# Patient Record
Sex: Female | Born: 1945 | Race: White | Hispanic: No | Smoking: Never smoker
Health system: Southern US, Community
[De-identification: ages and names within clinical notes are randomized; demographics above are authoritative.]

## PROBLEM LIST (undated history)

## (undated) DIAGNOSIS — H3320 Serous retinal detachment, unspecified eye: Secondary | ICD-10-CM

## (undated) DIAGNOSIS — I10 Essential (primary) hypertension: Secondary | ICD-10-CM

## (undated) DIAGNOSIS — E785 Hyperlipidemia, unspecified: Secondary | ICD-10-CM

## (undated) HISTORY — DX: Essential (primary) hypertension: I10

## (undated) HISTORY — DX: Serous retinal detachment, unspecified eye: H33.20

## (undated) HISTORY — DX: Hyperlipidemia, unspecified: E78.5

---

## 1998-01-08 ENCOUNTER — Other Ambulatory Visit: Admission: RE | Admit: 1998-01-08 | Discharge: 1998-01-08 | Payer: Self-pay | Admitting: *Deleted

## 1999-01-14 ENCOUNTER — Other Ambulatory Visit: Admission: RE | Admit: 1999-01-14 | Discharge: 1999-01-14 | Payer: Self-pay | Admitting: *Deleted

## 1999-06-24 ENCOUNTER — Ambulatory Visit (HOSPITAL_COMMUNITY): Admission: RE | Admit: 1999-06-24 | Discharge: 1999-06-24 | Payer: Self-pay | Admitting: *Deleted

## 2000-01-20 ENCOUNTER — Other Ambulatory Visit: Admission: RE | Admit: 2000-01-20 | Discharge: 2000-01-20 | Payer: Self-pay | Admitting: *Deleted

## 2000-01-25 ENCOUNTER — Ambulatory Visit (HOSPITAL_COMMUNITY): Admission: RE | Admit: 2000-01-25 | Discharge: 2000-01-25 | Payer: Self-pay | Admitting: *Deleted

## 2001-01-23 ENCOUNTER — Other Ambulatory Visit: Admission: RE | Admit: 2001-01-23 | Discharge: 2001-01-23 | Payer: Self-pay | Admitting: *Deleted

## 2001-02-16 ENCOUNTER — Encounter: Payer: Self-pay | Admitting: Gastroenterology

## 2001-02-16 ENCOUNTER — Ambulatory Visit (HOSPITAL_COMMUNITY): Admission: RE | Admit: 2001-02-16 | Discharge: 2001-02-16 | Payer: Self-pay | Admitting: Gastroenterology

## 2001-03-16 ENCOUNTER — Ambulatory Visit (HOSPITAL_COMMUNITY): Admission: RE | Admit: 2001-03-16 | Discharge: 2001-03-16 | Payer: Self-pay | Admitting: *Deleted

## 2004-02-24 ENCOUNTER — Other Ambulatory Visit: Admission: RE | Admit: 2004-02-24 | Discharge: 2004-02-24 | Payer: Self-pay | Admitting: Family Medicine

## 2005-02-23 ENCOUNTER — Other Ambulatory Visit: Admission: RE | Admit: 2005-02-23 | Discharge: 2005-02-23 | Payer: Self-pay | Admitting: Family Medicine

## 2006-02-28 ENCOUNTER — Other Ambulatory Visit: Admission: RE | Admit: 2006-02-28 | Discharge: 2006-02-28 | Payer: Self-pay | Admitting: Family Medicine

## 2006-04-11 ENCOUNTER — Emergency Department (HOSPITAL_COMMUNITY): Admission: EM | Admit: 2006-04-11 | Discharge: 2006-04-11 | Payer: Self-pay | Admitting: Emergency Medicine

## 2008-11-24 ENCOUNTER — Other Ambulatory Visit: Admission: RE | Admit: 2008-11-24 | Discharge: 2008-11-24 | Payer: Self-pay | Admitting: Family Medicine

## 2010-12-24 NOTE — Procedures (Signed)
[facility]  Patient:    Jessica Anthony, Jessica Anthony                 MRN: [id number] Proc. Date: 03/16/01 Adm. Date:  [id number] Attending:  Sabino Gasser                           Procedure Report  PROCEDURE:  Colonoscopy.  INDICATIONS:  Rectal bleeding.  ANESTHESIA:  Demerol 70, Versed 7 mg.  DESCRIPTION OF PROCEDURE:  With the patient mildly sedated in the left lateral decubitus position, the Olympus videoscopic colonoscope was inserted in the rectum and passed under direct vision to the cecum, identified by the ileocecal valve and appendiceal orifice.  Once identified, the colonoscopy was slowly withdrawn, taking circumferential views of the entire colonic mucosa, stopping only in the rectum, which appeared normal on direct view and showed internal hemorrhoids, which may have been the cause of the patients rectal bleeding on retroflex view.  The endoscope was straightened and withdrawn. The patients vital signs and pulse oximeter remained stable.  The patient tolerated the procedure well without apparent complications.  FINDINGS:  Internal hemorrhoids.  Otherwise unremarkable colonoscopic examination to the cecum.  PLAN:  Follow up with me as needed. DD:  03/16/01 TD:  03/17/01 Job: [id number]

## 2012-07-02 ENCOUNTER — Other Ambulatory Visit (HOSPITAL_COMMUNITY)
Admission: RE | Admit: 2012-07-02 | Discharge: 2012-07-02 | Disposition: A | Discharge status: Home / Self Care | Payer: PRIVATE HEALTH INSURANCE | Source: Ambulatory Visit | Attending: Family Medicine | Admitting: Family Medicine

## 2012-07-02 ENCOUNTER — Other Ambulatory Visit: Payer: Self-pay | Admitting: Family Medicine

## 2012-07-02 DIAGNOSIS — Z1151 Encounter for screening for human papillomavirus (HPV): Secondary | ICD-10-CM | POA: Insufficient documentation

## 2012-07-02 DIAGNOSIS — Z0000 Encounter for general adult medical examination without abnormal findings: Secondary | ICD-10-CM | POA: Insufficient documentation

## 2014-12-12 ENCOUNTER — Ambulatory Visit: Payer: Self-pay | Admitting: Cardiology

## 2014-12-15 ENCOUNTER — Encounter: Payer: Self-pay | Admitting: *Deleted

## 2014-12-19 ENCOUNTER — Encounter: Payer: Self-pay | Admitting: *Deleted

## 2014-12-19 ENCOUNTER — Ambulatory Visit (INDEPENDENT_AMBULATORY_CARE_PROVIDER_SITE_OTHER): Payer: Medicare Other | Admitting: Cardiology

## 2014-12-19 VITALS — BP 132/86 | HR 81 | Ht 64.0 in | Wt 118.0 lb

## 2014-12-19 DIAGNOSIS — E785 Hyperlipidemia, unspecified: Secondary | ICD-10-CM

## 2014-12-19 LAB — COMPREHENSIVE METABOLIC PANEL
ALT: 30 U/L (ref 0–35)
AST: 32 U/L (ref 0–37)
Albumin: 4.3 g/dL (ref 3.5–5.2)
Alkaline Phosphatase: 75 U/L (ref 39–117)
BUN: 11 mg/dL (ref 6–23)
CO2: 34 mEq/L — ABNORMAL HIGH (ref 19–32)
Calcium: 10.8 mg/dL — ABNORMAL HIGH (ref 8.4–10.5)
Chloride: 96 mEq/L (ref 96–112)
Creatinine, Ser: 0.73 mg/dL (ref 0.40–1.20)
GFR: 83.85 mL/min (ref >60.00)
Glucose, Bld: 99 mg/dL (ref 70–99)
Potassium: 4 mEq/L (ref 3.5–5.1)
Sodium: 135 mEq/L (ref 135–145)
Total Bilirubin: 0.4 mg/dL (ref 0.2–1.2)
Total Protein: 7.8 g/dL (ref 6.0–8.3)

## 2014-12-19 NOTE — Progress Notes (Signed)
Patient ID: Jessica Anthony, female   DOB: [birth year], 69 y.o.   MRN: [id number]      Cardiology Office Note   Date:  12/19/2014   ID:  Jessica Anthony, [place] [birth year], [name]  PCP:  Noland FordyceBHUSHAN,SUSAN D  Cardiologist:   Lars MassonNELSON, Adelaide Pfefferkorn H, MD   Chief complain: Hyperlipidemia   History of Present Illness: Jessica Anthony is a 69 y.o. female who presents for evaluation of hyperlipidemia. The patient is very active, younger appearing with no past medical history other than severe hyperlipidemia. We don't have hear actual numbers but she states that her total cholesterol gets as high and 300. She was started on the first cholesterol medicine at age 89, she has tried atorvastatin, simvastatin, rosuvastatin wit significant muscle pain. She has been taking simvastatin 80 mg po daily for few years, but then couldn't tolerate it.  She really wants to stay healthy and wants to get her cholesterol under control. She can walk 6 miles without any chest pain, or DOR. Also denies palpitations, syncope, claudications or orhopnea.  She doesn't have FH of premature CAD, SCD or CHF.     Past Medical History  Diagnosis Date  . Hyperlipemia   . Hypertension   . Detached retina     No past surgical history on file.   Current Outpatient Prescriptions  Medication Sig Dispense Refill  . LORazepam (ATIVAN) 1 MG tablet Take 1 tablet by mouth before bedtime as needed for insomnia  2  . triamterene-hydrochlorothiazide (MAXZIDE-25) 37.5-25 MG per tablet Take 1 tablet by mouth daily.  0   No current facility-administered medications for this visit.    Allergies:   Statins    Social History:  The patient  reports that she has never smoked. She does not have any smokeless tobacco history on file. She reports that she drinks alcohol. She reports that she does not use illicit drugs.   Family History:  The patient's family history includes CVA in her father; Heart attack (age of onset: 6482)  in her brother; Hyperlipidemia in an other family member; Hypertension in an other family member; Kidney cancer in her brother; Pancreatic cancer in her mother.    ROS:  Please see the history of present illness.   Otherwise, review of systems are positive for none.   All other systems are reviewed and negative.    PHYSICAL EXAM: VS:  There were no vitals taken for this visit. , BMI There is no height or weight on file to calculate BMI. GEN: Well nourished, well developed, in no acute distress HEENT: normal Neck: no JVD, carotid bruits, or masses Cardiac: RRR; no murmurs, rubs, or gallops,no edema  Respiratory:  clear to auscultation bilaterally, normal work of breathing GI: soft, nontender, nondistended, + BS MS: no deformity or atrophy Skin: warm and dry, no rash Neuro:  Strength and sensation are intact Psych: euthymic mood, full affect   EKG:  EKG is ordered today. The ekg ordered today demonstrates SR, normal ECG.   Recent Labs: No results found for requested labs within last 365 days.    Lipid Panel No results found for: CHOL, TRIG, HDL, CHOLHDL, VLDL, LDLCALC, LDLDIRECT    Wt Readings from Last 3 Encounters:  No data found for Wt      Other studies Reviewed: Additional studies/ records that were reviewed today include: records from PCP.    ASSESSMENT AND PLAN:  1.  Hyperlipidemia - we will order NMR lipids panel and lipoprotein a to  evaluate severity of her hyperlipidemia and to see if she potentially has homozygous/hetrozygous familial HLP. She has a good diet and high exercise level, so there is no room for improvement there. We will refer her to the lipid clinic for further management.   Signed, Lars MassonNELSON, Gwynn Crossley H, MD  12/19/2014 10:23 AM    [facility] [address], [place], [place]  [address] Phone: [phone]; Fax: [phone]

## 2014-12-19 NOTE — Patient Instructions (Signed)
Medication Instructions:   Your physician recommends that you continue on your current medications as directed. Please refer to the Current Medication list given to you today.   Labwork:  TODAY---CMET, NMR WITH LIPIDS, AND LIPO A    [facility]PERLIPIDEMIA     Follow-Up:  AS NEEDED WITH DR Delton SeeNELSON

## 2014-12-22 LAB — NMR LIPOPROFILE WITH LIPIDS
Cholesterol, Total: 319 mg/dL — ABNORMAL HIGH (ref 100–199)
HDL Particle Number: 46 umol/L (ref >=30.5)
HDL Size: 9.1 nm — ABNORMAL LOW (ref >=9.2)
HDL-C: 71 mg/dL (ref >39)
LDL (calc): 197 mg/dL — ABNORMAL HIGH (ref 0–99)
LDL Particle Number: 2964 nmol/L — ABNORMAL HIGH (ref <1000)
LDL Size: 20.5 nm (ref >=20.8)
LP-IR Score: 65 — ABNORMAL HIGH (ref <=45)
Large HDL-P: 5.7 umol/L (ref >=4.8)
Large VLDL-P: 11.1 nmol/L — ABNORMAL HIGH (ref <=2.7)
Small LDL Particle Number: 1833 nmol/L — ABNORMAL HIGH (ref <=527)
Triglycerides: 255 mg/dL — ABNORMAL HIGH (ref 0–149)
VLDL Size: 51.8 nm — ABNORMAL HIGH (ref <=46.6)

## 2014-12-22 LAB — LIPOPROTEIN A (LPA): Lipoprotein (a): 6 mg/dL (ref 0–30)

## 2014-12-24 ENCOUNTER — Ambulatory Visit (INDEPENDENT_AMBULATORY_CARE_PROVIDER_SITE_OTHER): Payer: Medicare Other | Admitting: Pharmacist

## 2014-12-24 DIAGNOSIS — E785 Hyperlipidemia, unspecified: Secondary | ICD-10-CM | POA: Diagnosis not present

## 2014-12-24 NOTE — Patient Instructions (Signed)
When you get home, call Kennon RoundsSally at [phone] to let me know what dose your Crestor is at home.

## 2015-01-20 NOTE — Progress Notes (Signed)
Patient ID: Jessica Anthony, female   DOB: Apr 14, 1946, 69 y.o.   MRN: [id number]      Cardiology Office Note   Date:  01/20/2015   ID:  Jessica Anthony, Jessica Anthony [birth year], MRN [id number]  PCP:  Allean Found, MD  Cardiologist:  Tobias Alexander, MD  Chief complain: Hyperlipidemia   History of Present Illness: Jessica Anthony is a 69 y.o. female who presents for evaluation of hyperlipidemia. She has no past medical history other than severe hyperlipidemia and HTN. She was started on the first cholesterol medicine at age 17.  She states she tried Crestor and developed muscle aches after a year.  She took simvastatin 80mg  then decreased to 40mg  and developed muscle aches after several years.  She has also tried Lipitor with muscle aches.  She does not recall taking Zetia or fibrate in the past.      Family History- No family history of early heart disease or stroke.  Physical Exam- pt has no evidence of tendon xanthomas or corneal arcus   RF: age, HTN  LDL goal <130 mg/dL  Current Medications: none Intolerances: Crestor 10mg  daily, simvastatin 40mg  and 80mg , Lipitor   Labs:  TC: 319, TG 255, LDL 197, LDL-P 2964, Lp(a)- 6  Past Medical History  Diagnosis Date  . Hyperlipemia   . Hypertension   . Detached retina      Current Outpatient Prescriptions  Medication Sig Dispense Refill  . Multiple Minerals-Vitamins (CALCIUM-MAGNESIUM-ZINC-D3 PO) Take 1 tablet by mouth 2 (two) times daily.    . Multiple Vitamins-Minerals (MULTIVITAMIN ADULT PO) Take 1 tablet by mouth daily.    Marland Kitchen LORazepam (ATIVAN) 1 MG tablet Take 1 tablet by mouth before bedtime as needed for insomnia  2  . triamterene-hydrochlorothiazide (MAXZIDE-25) 37.5-25 MG per tablet Take 1 tablet by mouth daily.  0   No current facility-administered medications for this visit.    Allergies:   Statins    Social History:  The patient  reports that she has never smoked. She does not have any smokeless  tobacco history on file. She reports that she drinks alcohol. She reports that she does not use illicit drugs.     ASSESSMENT AND PLAN:  1.  Hyperlipidemia -  Pt does not have a history of ASCVD or qualify as FH, but given her LDL >190 mg/dL, she does qualify for treatment with a statin.  All of her past intolerances have resulted after being on therapy for > 1 year so may be more likely related to dose rather than true statin intolerance.  Discussed primary prevention data related to statins versus other medication options.  She is willing to retry statin. She has Crestor 10mg  tablets at home still.  Will have her take 5mg  three days of the week to see if she tolerates.  If so, will recheck labs in 3 months.

## 2015-11-02 ENCOUNTER — Other Ambulatory Visit (HOSPITAL_COMMUNITY)
Admission: RE | Admit: 2015-11-02 | Discharge: 2015-11-02 | Disposition: A | Discharge status: Home / Self Care | Payer: Medicare Other | Source: Ambulatory Visit | Attending: Family Medicine | Admitting: Family Medicine

## 2015-11-02 ENCOUNTER — Other Ambulatory Visit: Payer: Self-pay | Admitting: Family Medicine

## 2015-11-02 DIAGNOSIS — Z124 Encounter for screening for malignant neoplasm of cervix: Secondary | ICD-10-CM | POA: Insufficient documentation

## 2015-11-03 LAB — CYTOLOGY - PAP

## 2018-01-24 ENCOUNTER — Encounter (HOSPITAL_COMMUNITY): Payer: Self-pay | Admitting: Emergency Medicine

## 2018-01-24 ENCOUNTER — Emergency Department (HOSPITAL_COMMUNITY)
Admission: EM | Admit: 2018-01-24 | Discharge: 2018-01-25 | Disposition: A | Discharge status: Home / Self Care | Payer: Medicare Other | Attending: Emergency Medicine | Admitting: Emergency Medicine

## 2018-01-24 DIAGNOSIS — Z79899 Other long term (current) drug therapy: Secondary | ICD-10-CM | POA: Insufficient documentation

## 2018-01-24 DIAGNOSIS — I10 Essential (primary) hypertension: Secondary | ICD-10-CM | POA: Diagnosis not present

## 2018-01-24 LAB — BASIC METABOLIC PANEL
Anion gap: 8 (ref 5–15)
BUN: 21 mg/dL — AB (ref 6–20)
CHLORIDE: 106 mmol/L (ref 101–111)
CO2: 27 mmol/L (ref 22–32)
CREATININE: 0.71 mg/dL (ref 0.44–1.00)
Calcium: 11.2 mg/dL — ABNORMAL HIGH (ref 8.9–10.3)
Glucose, Bld: 113 mg/dL — ABNORMAL HIGH (ref 65–99)
POTASSIUM: 3.9 mmol/L (ref 3.5–5.1)
SODIUM: 141 mmol/L (ref 135–145)

## 2018-01-24 LAB — CBC
HCT: 44.6 % (ref 36.0–46.0)
HEMOGLOBIN: 14.8 g/dL (ref 12.0–15.0)
MCH: 29.6 pg (ref 26.0–34.0)
MCHC: 33.2 g/dL (ref 30.0–36.0)
MCV: 89.2 fL (ref 78.0–100.0)
PLATELETS: 267 10*3/uL (ref 150–400)
RBC: 5 MIL/uL (ref 3.87–5.11)
RDW: 13.6 % (ref 11.5–15.5)
WBC: 8.9 10*3/uL (ref 4.0–10.5)

## 2018-01-24 MED ORDER — HYDROCHLOROTHIAZIDE 12.5 MG PO CAPS
12.5000 mg | ORAL_CAPSULE | Freq: Once | ORAL | Status: AC
  Administered 2018-01-25: 12.5 mg via ORAL
  Filled 2018-01-24: qty 1

## 2018-01-24 MED ORDER — AMLODIPINE BESYLATE 5 MG PO TABS
5.0000 mg | ORAL_TABLET | Freq: Once | ORAL | Status: AC
  Administered 2018-01-24: 5 mg via ORAL
  Filled 2018-01-24: qty 1

## 2018-01-24 MED ORDER — LOSARTAN POTASSIUM 50 MG PO TABS
50.0000 mg | ORAL_TABLET | Freq: Once | ORAL | Status: AC
  Administered 2018-01-25: 50 mg via ORAL
  Filled 2018-01-24: qty 1

## 2018-01-24 MED ORDER — LOSARTAN POTASSIUM-HCTZ 50-12.5 MG PO TABS: 2.0000 | ORAL_TABLET | Freq: Every day | ORAL | 1 refills | Status: DC

## 2018-01-24 NOTE — ED Triage Notes (Addendum)
Patient c/o difficulty managing BP x2 months. Reports change in BP meds x2 weeks ago with no relief. BP 184/106 in triage. Denies headache, blurred vision, chest pain. Patient also adds she started taking anti depression medication x2 days ago, prescribed by PCP.

## 2018-01-24 NOTE — ED Provider Notes (Signed)
[facility]-EMERGENCY DEPT Provider Note   CSN: [id number] Arrival date & time: 01/24/18  2112     History   Chief Complaint Chief Complaint  Patient presents with  . Hypertension    HPI Jessica Anthony is a 72 y.o. female.  HPI Patient presents to the emergency room for evaluation of hypertension.  Patient states normally her blood pressure is well controlled.  This evening she felt a little lightheaded.  She has been having some trouble with anxiety and depression recently and she started a new medication.  she went to a pharmacy and had her blood pressure checked and it was very elevated greater than 200.Marland Kitchen  Patient became concerned and presented to the ED for evaluation. she denies any chest pain or shortness of breath.  No numbness or weakness. Past Medical History:  Diagnosis Date  . Detached retina   . Hyperlipemia   . Hypertension     Patient Active Problem List   Diagnosis Date Noted  . Hyperlipidemia 12/19/2014    History reviewed. No pertinent surgical history.   OB History   None      Home Medications    Prior to Admission medications   Medication Sig Start Date End Date Taking? Authorizing Provider  fenofibrate 54 MG tablet Take 54 mg by mouth daily. 11/13/17  Yes [provider]  folic acid (FOLVITE) 400 MCG tablet Take 400 mcg by mouth daily.   Yes [provider]  LORazepam (ATIVAN) 1 MG tablet Take 1 tablet by mouth before bedtime as needed for insomnia 12/15/14  Yes [provider]  Multiple Minerals-Vitamins (CALCIUM-MAGNESIUM-ZINC-D3 PO) Take 1 tablet by mouth 2 (two) times daily.   Yes [provider]  Multiple Vitamins-Minerals (MULTIVITAMIN ADULT PO) Take 1 tablet by mouth daily.   Yes [provider]  pravastatin (PRAVACHOL) 40 MG tablet Take 40 mg by mouth daily. 12/28/17  Yes [provider]  sertraline (ZOLOFT) 25 MG tablet Take 25 mg by mouth daily. 01/22/18  Yes  [provider]  losartan-hydrochlorothiazide (HYZAAR) 50-12.5 MG tablet Take 2 tablets by mouth daily. 01/24/18   Linwood Dibbles, MD    Family History Family History  Problem Relation Age of Onset  . Pancreatic cancer Mother   . CVA Father   . Kidney cancer Brother   . Heart attack Brother 3  . Hyperlipidemia Unknown   . Hypertension Unknown     Social History Social History   Tobacco Use  . Smoking status: Never Smoker  Substance Use Topics  . Alcohol use: Yes    Alcohol/week: 0.0 oz  . Drug use: No     Allergies   Statins   Review of Systems Review of Systems  All other systems reviewed and are negative.    Physical Exam Updated Vital Signs BP (!) 191/115   Pulse 72   Temp 98.1 F (36.7 C) (Oral)   Resp 14   SpO2 97%   Physical Exam  Constitutional: She appears well-developed and well-nourished. No distress.  HENT:  Head: Normocephalic and atraumatic.  Right Ear: External ear normal.  Left Ear: External ear normal.  Eyes: Conjunctivae are normal. Right eye exhibits no discharge. Left eye exhibits no discharge. No scleral icterus.  Neck: Neck supple. No tracheal deviation present.  Cardiovascular: Normal rate, regular rhythm and intact distal pulses.  Pulmonary/Chest: Effort normal and breath sounds normal. No stridor. No respiratory distress. She has no wheezes. She has no rales.  Abdominal: Soft. Bowel  sounds are normal. She exhibits no distension. There is no tenderness. There is no rebound and no guarding.  Musculoskeletal: She exhibits no edema or tenderness.  Neurological: She is alert. She has normal strength. No cranial nerve deficit (no facial droop, extraocular movements intact, no slurred speech) or sensory deficit. She exhibits normal muscle tone. She displays no seizure activity. Coordination normal.  Skin: Skin is warm and dry. No rash noted.  Psychiatric: She has a normal mood and affect.  Nursing note and vitals reviewed.    ED  Treatments / Results  Labs (all labs ordered are listed, but only abnormal results are displayed) Labs Reviewed  BASIC METABOLIC PANEL - Abnormal; Notable for the following components:      Result Value   Glucose, Bld 113 (*)    BUN 21 (*)    Calcium 11.2 (*)    All other components within normal limits  CBC    EKG EKG Interpretation  Date/Time:  Wednesday January 24 2018 22:07:19 EDT Ventricular Rate:  81 PR Interval:    QRS Duration: 86 QT Interval:  359 QTC Calculation: 417 R Axis:   56 Text Interpretation:  Sinus rhythm LAE, consider biatrial enlargement Abnormal R-wave progression, early transition No old tracing to compare Confirmed by Linwood DibblesKnapp, Addalynn Kumari [phone]([id number]) on 01/24/2018 10:10:07 PM   Radiology No results found.  Procedures Procedures (including critical care time)  Medications Ordered in ED Medications  losartan (COZAAR) tablet 50 mg (has no administration in time range)  hydrochlorothiazide (MICROZIDE) capsule 12.5 mg (has no administration in time range)  amLODipine (NORVASC) tablet 5 mg (5 mg Oral Given 01/24/18 2329)     Initial Impression / Assessment and Plan / ED Course  I have reviewed the triage vital signs and the nursing notes.  Pertinent labs & imaging results that were available during my care of the patient were reviewed by me and considered in my medical decision making (see chart for details).  Clinical Course as of Jan 24 2353  Wed Jan 24, 2018  2305 Pt remains hypertensive.  Labs are otherwise reassuring.  Patient will be given a dose of oral antihypertensive agents.  I will increase her Hyzaar and have her follow-up with her primary care doctor.   [JK]  2354 BP still elevated.  Will also give an additional dose of hyzar.   [JK]    Clinical Course User Index [JK] Linwood DibblesKnapp, Perla Echavarria, MD    Pt presented with HTN.  Asymptomatic in the ED. Labs are normal.  EKG reassuring.  No signs of stroke.  Will dc home with increasing her hyzaar dose.  Final  Clinical Impressions(s) / ED Diagnoses   Final diagnoses:  Hypertension, unspecified type    ED Discharge Orders        Ordered    losartan-hydrochlorothiazide (HYZAAR) 50-12.5 MG tablet  Daily     01/24/18 2306       Linwood DibblesKnapp, Karder Goodin, MD 01/24/18 2355

## 2018-01-24 NOTE — ED Notes (Signed)
EKG given to EDP,Knapp,J., MD., for review. 

## 2018-01-24 NOTE — ED Notes (Signed)
Pt denies blurred vision, CP, SOB with her increase in BP.

## 2018-01-24 NOTE — Discharge Instructions (Signed)
Increase your losartan hydrochlorothiazide medication to, 2 tablets daily as prescribed.  Follow-up with your primary care doctor next week to recheck your blood pressure

## 2019-10-10 ENCOUNTER — Ambulatory Visit: Payer: Medicare Other | Attending: Internal Medicine

## 2019-10-10 DIAGNOSIS — Z23 Encounter for immunization: Secondary | ICD-10-CM | POA: Insufficient documentation

## 2019-10-10 NOTE — Progress Notes (Signed)
   Covid-19 Vaccination Clinic  Name:  Jessica Anthony    MRN: [id number] DOB: 1946/07/26  10/10/2019  Jessica Anthony was observed post Covid-19 immunization for 15 minutes without incident. She was provided with Vaccine Information Sheet and instruction to access the V-Safe system.   Jessica Anthony was instructed to call 911 with any severe reactions post vaccine: Marland Kitchen Difficulty breathing  . Swelling of face and throat  . A fast heartbeat  . A bad rash all over body  . Dizziness and weakness   Immunizations Administered    Name Date Dose VIS Date Route   Pfizer COVID-19 Vaccine 10/10/2019  8:35 AM 0.3 mL 07/19/2019 Intramuscular   Manufacturer: [employer], [employer]   Lot: [id number]   NDC: [address]-0

## 2019-11-04 ENCOUNTER — Ambulatory Visit: Payer: Medicare Other | Attending: Internal Medicine

## 2019-11-04 DIAGNOSIS — Z23 Encounter for immunization: Secondary | ICD-10-CM

## 2019-11-04 NOTE — Progress Notes (Signed)
   Covid-19 Vaccination Clinic  Name:  Jessica Anthony    MRN: [id number] DOB: 03-28-1946  11/04/2019  [name] was observed post Covid-19 immunization for 15 minutes without incident. She was provided with Vaccine Information Sheet and instruction to access the V-Safe system.   [name] was instructed to call 911 with any severe reactions post vaccine: Marland Kitchen Difficulty breathing  . Swelling of face and throat  . A fast heartbeat  . A bad rash all over body  . Dizziness and weakness   Immunizations Administered    Name Date Dose VIS Date Route   Pfizer COVID-19 Vaccine 11/04/2019  8:14 AM 0.3 mL 07/19/2019 Intramuscular   Manufacturer: [employer], [employer]   Lot: [id number]   NDC: [address]-6

## 2020-09-04 DIAGNOSIS — E7800 Pure hypercholesterolemia, unspecified: Secondary | ICD-10-CM | POA: Diagnosis not present

## 2020-09-04 DIAGNOSIS — M8580 Other specified disorders of bone density and structure, unspecified site: Secondary | ICD-10-CM | POA: Diagnosis not present

## 2020-09-04 DIAGNOSIS — G4700 Insomnia, unspecified: Secondary | ICD-10-CM | POA: Diagnosis not present

## 2020-09-04 DIAGNOSIS — I10 Essential (primary) hypertension: Secondary | ICD-10-CM | POA: Diagnosis not present

## 2020-09-04 DIAGNOSIS — E782 Mixed hyperlipidemia: Secondary | ICD-10-CM | POA: Diagnosis not present

## 2020-09-22 DIAGNOSIS — R61 Generalized hyperhidrosis: Secondary | ICD-10-CM | POA: Diagnosis not present

## 2020-10-01 DIAGNOSIS — I10 Essential (primary) hypertension: Secondary | ICD-10-CM | POA: Diagnosis not present

## 2020-10-01 DIAGNOSIS — E782 Mixed hyperlipidemia: Secondary | ICD-10-CM | POA: Diagnosis not present

## 2020-10-01 DIAGNOSIS — M8580 Other specified disorders of bone density and structure, unspecified site: Secondary | ICD-10-CM | POA: Diagnosis not present

## 2020-10-01 DIAGNOSIS — E7800 Pure hypercholesterolemia, unspecified: Secondary | ICD-10-CM | POA: Diagnosis not present

## 2020-10-01 DIAGNOSIS — G4700 Insomnia, unspecified: Secondary | ICD-10-CM | POA: Diagnosis not present

## 2020-11-02 DIAGNOSIS — M8580 Other specified disorders of bone density and structure, unspecified site: Secondary | ICD-10-CM | POA: Diagnosis not present

## 2020-11-02 DIAGNOSIS — E782 Mixed hyperlipidemia: Secondary | ICD-10-CM | POA: Diagnosis not present

## 2020-11-02 DIAGNOSIS — E7800 Pure hypercholesterolemia, unspecified: Secondary | ICD-10-CM | POA: Diagnosis not present

## 2020-11-02 DIAGNOSIS — G4700 Insomnia, unspecified: Secondary | ICD-10-CM | POA: Diagnosis not present

## 2020-11-02 DIAGNOSIS — I10 Essential (primary) hypertension: Secondary | ICD-10-CM | POA: Diagnosis not present

## 2020-11-24 DIAGNOSIS — G4700 Insomnia, unspecified: Secondary | ICD-10-CM | POA: Diagnosis not present

## 2020-11-24 DIAGNOSIS — E782 Mixed hyperlipidemia: Secondary | ICD-10-CM | POA: Diagnosis not present

## 2020-11-24 DIAGNOSIS — I10 Essential (primary) hypertension: Secondary | ICD-10-CM | POA: Diagnosis not present

## 2020-11-24 DIAGNOSIS — M8580 Other specified disorders of bone density and structure, unspecified site: Secondary | ICD-10-CM | POA: Diagnosis not present

## 2020-11-24 DIAGNOSIS — E7800 Pure hypercholesterolemia, unspecified: Secondary | ICD-10-CM | POA: Diagnosis not present

## 2020-12-25 DIAGNOSIS — G4700 Insomnia, unspecified: Secondary | ICD-10-CM | POA: Diagnosis not present

## 2020-12-25 DIAGNOSIS — I10 Essential (primary) hypertension: Secondary | ICD-10-CM | POA: Diagnosis not present

## 2020-12-25 DIAGNOSIS — E7800 Pure hypercholesterolemia, unspecified: Secondary | ICD-10-CM | POA: Diagnosis not present

## 2020-12-25 DIAGNOSIS — E782 Mixed hyperlipidemia: Secondary | ICD-10-CM | POA: Diagnosis not present

## 2020-12-25 DIAGNOSIS — M8580 Other specified disorders of bone density and structure, unspecified site: Secondary | ICD-10-CM | POA: Diagnosis not present

## 2020-12-26 ENCOUNTER — Telehealth: Payer: Self-pay | Admitting: Adult Health

## 2020-12-26 NOTE — Telephone Encounter (Signed)
Called to discuss with patient about COVID-19 symptoms and the use of one of the available treatments for those with mild to moderate Covid symptoms and at a high risk of hospitalization.  Pt appears to qualify for outpatient treatment due to co-morbid conditions and/or a member of an at-risk group in accordance with the FDA Emergency Use Authorization.    Symptom onset: 2 weeks ago Vaccinated: yes Booster? unclear Immunocompromised? no Qualifiers: age,  NIH Criteria: 3  Patient is outside window to receive COVID treatment.  She was recommended to continue supportive care measures.     Jessica Anthony

## 2021-01-18 DIAGNOSIS — E7800 Pure hypercholesterolemia, unspecified: Secondary | ICD-10-CM | POA: Diagnosis not present

## 2021-01-18 DIAGNOSIS — Z1389 Encounter for screening for other disorder: Secondary | ICD-10-CM | POA: Diagnosis not present

## 2021-01-18 DIAGNOSIS — I10 Essential (primary) hypertension: Secondary | ICD-10-CM | POA: Diagnosis not present

## 2021-01-18 DIAGNOSIS — Z0000 Encounter for general adult medical examination without abnormal findings: Secondary | ICD-10-CM | POA: Diagnosis not present

## 2021-01-18 DIAGNOSIS — Z1159 Encounter for screening for other viral diseases: Secondary | ICD-10-CM | POA: Diagnosis not present

## 2021-01-18 DIAGNOSIS — E782 Mixed hyperlipidemia: Secondary | ICD-10-CM | POA: Diagnosis not present

## 2021-01-28 DIAGNOSIS — E7800 Pure hypercholesterolemia, unspecified: Secondary | ICD-10-CM | POA: Diagnosis not present

## 2021-01-28 DIAGNOSIS — I10 Essential (primary) hypertension: Secondary | ICD-10-CM | POA: Diagnosis not present

## 2021-01-28 DIAGNOSIS — G4700 Insomnia, unspecified: Secondary | ICD-10-CM | POA: Diagnosis not present

## 2021-01-28 DIAGNOSIS — E782 Mixed hyperlipidemia: Secondary | ICD-10-CM | POA: Diagnosis not present

## 2021-01-28 DIAGNOSIS — M8580 Other specified disorders of bone density and structure, unspecified site: Secondary | ICD-10-CM | POA: Diagnosis not present

## 2021-02-22 DIAGNOSIS — L237 Allergic contact dermatitis due to plants, except food: Secondary | ICD-10-CM | POA: Diagnosis not present

## 2021-05-11 DIAGNOSIS — H02401 Unspecified ptosis of right eyelid: Secondary | ICD-10-CM | POA: Diagnosis not present

## 2021-05-11 DIAGNOSIS — H04123 Dry eye syndrome of bilateral lacrimal glands: Secondary | ICD-10-CM | POA: Diagnosis not present

## 2021-05-11 DIAGNOSIS — H31091 Other chorioretinal scars, right eye: Secondary | ICD-10-CM | POA: Diagnosis not present

## 2021-05-11 DIAGNOSIS — Z961 Presence of intraocular lens: Secondary | ICD-10-CM | POA: Diagnosis not present

## 2021-05-11 DIAGNOSIS — Z9889 Other specified postprocedural states: Secondary | ICD-10-CM | POA: Diagnosis not present

## 2021-07-07 ENCOUNTER — Inpatient Hospital Stay (HOSPITAL_COMMUNITY)
Admission: EM | Admit: 2021-07-07 | Discharge: 2021-07-10 | DRG: 522 | Disposition: A | Discharge status: Home / Self Care | Payer: Medicare Other | Attending: Internal Medicine | Admitting: Internal Medicine

## 2021-07-07 ENCOUNTER — Other Ambulatory Visit: Payer: Self-pay

## 2021-07-07 ENCOUNTER — Emergency Department (HOSPITAL_COMMUNITY): Payer: Medicare Other

## 2021-07-07 ENCOUNTER — Encounter (HOSPITAL_COMMUNITY): Payer: Self-pay

## 2021-07-07 DIAGNOSIS — M25572 Pain in left ankle and joints of left foot: Secondary | ICD-10-CM | POA: Diagnosis not present

## 2021-07-07 DIAGNOSIS — S72011A Unspecified intracapsular fracture of right femur, initial encounter for closed fracture: Secondary | ICD-10-CM | POA: Diagnosis not present

## 2021-07-07 DIAGNOSIS — F32A Depression, unspecified: Secondary | ICD-10-CM | POA: Diagnosis present

## 2021-07-07 DIAGNOSIS — S72001D Fracture of unspecified part of neck of right femur, subsequent encounter for closed fracture with routine healing: Secondary | ICD-10-CM

## 2021-07-07 DIAGNOSIS — Z79899 Other long term (current) drug therapy: Secondary | ICD-10-CM | POA: Diagnosis not present

## 2021-07-07 DIAGNOSIS — E782 Mixed hyperlipidemia: Secondary | ICD-10-CM | POA: Diagnosis not present

## 2021-07-07 DIAGNOSIS — Z823 Family history of stroke: Secondary | ICD-10-CM

## 2021-07-07 DIAGNOSIS — Z043 Encounter for examination and observation following other accident: Secondary | ICD-10-CM | POA: Diagnosis not present

## 2021-07-07 DIAGNOSIS — Z96641 Presence of right artificial hip joint: Secondary | ICD-10-CM | POA: Diagnosis not present

## 2021-07-07 DIAGNOSIS — E876 Hypokalemia: Secondary | ICD-10-CM | POA: Diagnosis not present

## 2021-07-07 DIAGNOSIS — Z471 Aftercare following joint replacement surgery: Secondary | ICD-10-CM | POA: Diagnosis not present

## 2021-07-07 DIAGNOSIS — Z8249 Family history of ischemic heart disease and other diseases of the circulatory system: Secondary | ICD-10-CM | POA: Diagnosis not present

## 2021-07-07 DIAGNOSIS — S72009A Fracture of unspecified part of neck of unspecified femur, initial encounter for closed fracture: Secondary | ICD-10-CM | POA: Diagnosis present

## 2021-07-07 DIAGNOSIS — Y92009 Unspecified place in unspecified non-institutional (private) residence as the place of occurrence of the external cause: Secondary | ICD-10-CM | POA: Diagnosis not present

## 2021-07-07 DIAGNOSIS — F419 Anxiety disorder, unspecified: Secondary | ICD-10-CM | POA: Diagnosis present

## 2021-07-07 DIAGNOSIS — M25551 Pain in right hip: Secondary | ICD-10-CM | POA: Diagnosis not present

## 2021-07-07 DIAGNOSIS — R404 Transient alteration of awareness: Secondary | ICD-10-CM | POA: Diagnosis not present

## 2021-07-07 DIAGNOSIS — S72001A Fracture of unspecified part of neck of right femur, initial encounter for closed fracture: Secondary | ICD-10-CM | POA: Diagnosis not present

## 2021-07-07 DIAGNOSIS — J986 Disorders of diaphragm: Secondary | ICD-10-CM | POA: Diagnosis not present

## 2021-07-07 DIAGNOSIS — Z888 Allergy status to other drugs, medicaments and biological substances status: Secondary | ICD-10-CM

## 2021-07-07 DIAGNOSIS — Z20822 Contact with and (suspected) exposure to covid-19: Secondary | ICD-10-CM | POA: Diagnosis not present

## 2021-07-07 DIAGNOSIS — Z743 Need for continuous supervision: Secondary | ICD-10-CM | POA: Diagnosis not present

## 2021-07-07 DIAGNOSIS — I10 Essential (primary) hypertension: Secondary | ICD-10-CM | POA: Diagnosis present

## 2021-07-07 DIAGNOSIS — S72041A Displaced fracture of base of neck of right femur, initial encounter for closed fracture: Secondary | ICD-10-CM | POA: Diagnosis not present

## 2021-07-07 DIAGNOSIS — W19XXXA Unspecified fall, initial encounter: Secondary | ICD-10-CM | POA: Diagnosis not present

## 2021-07-07 DIAGNOSIS — E785 Hyperlipidemia, unspecified: Secondary | ICD-10-CM | POA: Diagnosis not present

## 2021-07-07 DIAGNOSIS — E7800 Pure hypercholesterolemia, unspecified: Secondary | ICD-10-CM | POA: Diagnosis present

## 2021-07-07 LAB — TYPE AND SCREEN
ABO/RH(D): A POS
Antibody Screen: NEGATIVE

## 2021-07-07 LAB — CBC WITH DIFFERENTIAL/PLATELET
Abs Immature Granulocytes: 0.08 10*3/uL — ABNORMAL HIGH (ref 0.00–0.07)
Basophils Absolute: 0 10*3/uL (ref 0.0–0.1)
Basophils Relative: 0 %
Eosinophils Absolute: 0 10*3/uL (ref 0.0–0.5)
Eosinophils Relative: 0 %
HCT: 43.4 % (ref 36.0–46.0)
Hemoglobin: 14.5 g/dL (ref 12.0–15.0)
Immature Granulocytes: 1 %
Lymphocytes Relative: 8 %
Lymphs Abs: 1.1 10*3/uL (ref 0.7–4.0)
MCH: 28.4 pg (ref 26.0–34.0)
MCHC: 33.4 g/dL (ref 30.0–36.0)
MCV: 85.1 fL (ref 80.0–100.0)
Monocytes Absolute: 0.7 10*3/uL (ref 0.1–1.0)
Monocytes Relative: 5 %
Neutro Abs: 11.5 10*3/uL — ABNORMAL HIGH (ref 1.7–7.7)
Neutrophils Relative %: 86 %
Platelets: 290 10*3/uL (ref 150–400)
RBC: 5.1 MIL/uL (ref 3.87–5.11)
RDW: 13.2 % (ref 11.5–15.5)
WBC: 13.3 10*3/uL — ABNORMAL HIGH (ref 4.0–10.5)
nRBC: 0 % (ref 0.0–0.2)

## 2021-07-07 LAB — BASIC METABOLIC PANEL
Anion gap: 10 (ref 5–15)
BUN: 15 mg/dL (ref 8–23)
CO2: 23 mmol/L (ref 22–32)
Calcium: 9 mg/dL (ref 8.9–10.3)
Chloride: 101 mmol/L (ref 98–111)
Creatinine, Ser: 0.65 mg/dL (ref 0.44–1.00)
GFR, Estimated: >60 mL/min (ref >60)
Glucose, Bld: 128 mg/dL — ABNORMAL HIGH (ref 70–99)
Potassium: 3.5 mmol/L (ref 3.5–5.1)
Sodium: 134 mmol/L — ABNORMAL LOW (ref 135–145)

## 2021-07-07 LAB — RESP PANEL BY RT-PCR (FLU A&B, COVID) ARPGX2
Influenza A by PCR: NEGATIVE
Influenza B by PCR: NEGATIVE
SARS Coronavirus 2 by RT PCR: NEGATIVE

## 2021-07-07 LAB — PROTIME-INR
INR: 0.9 (ref 0.8–1.2)
Prothrombin Time: 12.2 seconds (ref 11.4–15.2)

## 2021-07-07 MED ORDER — LORAZEPAM 1 MG PO TABS
1.0000 mg | ORAL_TABLET | Freq: Every day | ORAL | Status: DC
  Administered 2021-07-08 – 2021-07-09 (×3): 1 mg via ORAL
  Filled 2021-07-07 (×3): qty 1

## 2021-07-07 MED ORDER — FENOFIBRATE 54 MG PO TABS
54.0000 mg | ORAL_TABLET | Freq: Every day | ORAL | Status: DC
  Administered 2021-07-08 – 2021-07-10 (×3): 54 mg via ORAL
  Filled 2021-07-07 (×3): qty 1

## 2021-07-07 MED ORDER — SORBITOL 70 % SOLN
30.0000 mL | Freq: Every day | Status: DC | PRN
  Filled 2021-07-07: qty 30

## 2021-07-07 MED ORDER — SENNA 8.6 MG PO TABS
1.0000 | ORAL_TABLET | Freq: Two times a day (BID) | ORAL | Status: DC
  Administered 2021-07-07 – 2021-07-08 (×2): 8.6 mg via ORAL
  Filled 2021-07-07 (×2): qty 1

## 2021-07-07 MED ORDER — MORPHINE SULFATE (PF) 2 MG/ML IV SOLN
0.5000 mg | INTRAVENOUS | Status: DC | PRN
  Administered 2021-07-07 – 2021-07-08 (×3): 0.5 mg via INTRAVENOUS
  Filled 2021-07-07 (×3): qty 1

## 2021-07-07 MED ORDER — LORAZEPAM 1 MG PO TABS: 1.0000 mg | ORAL_TABLET | Freq: Every day | ORAL | Status: DC | PRN

## 2021-07-07 MED ORDER — PRAVASTATIN SODIUM 20 MG PO TABS
40.0000 mg | ORAL_TABLET | Freq: Every day | ORAL | Status: DC
  Administered 2021-07-08 – 2021-07-10 (×3): 40 mg via ORAL
  Filled 2021-07-07 (×3): qty 2

## 2021-07-07 MED ORDER — HYDROCODONE-ACETAMINOPHEN 5-325 MG PO TABS
1.0000 | ORAL_TABLET | Freq: Four times a day (QID) | ORAL | Status: DC | PRN
  Administered 2021-07-07 – 2021-07-10 (×5): 1 via ORAL
  Filled 2021-07-07 (×6): qty 1

## 2021-07-07 MED ORDER — SODIUM CHLORIDE 0.9 % IV SOLN: INTRAVENOUS | Status: DC

## 2021-07-07 MED ORDER — ENOXAPARIN SODIUM 40 MG/0.4ML IJ SOSY: 40.0000 mg | PREFILLED_SYRINGE | INTRAMUSCULAR | Status: DC

## 2021-07-07 MED ORDER — ONDANSETRON HCL 4 MG/2ML IJ SOLN
4.0000 mg | Freq: Once | INTRAMUSCULAR | Status: AC
  Administered 2021-07-07: 4 mg via INTRAVENOUS
  Filled 2021-07-07: qty 2

## 2021-07-07 MED ORDER — ENOXAPARIN SODIUM 30 MG/0.3ML IJ SOSY
30.0000 mg | PREFILLED_SYRINGE | Freq: Once | INTRAMUSCULAR | Status: AC
  Administered 2021-07-07: 30 mg via SUBCUTANEOUS
  Filled 2021-07-07: qty 0.3

## 2021-07-07 MED ORDER — LOSARTAN POTASSIUM 50 MG PO TABS
100.0000 mg | ORAL_TABLET | Freq: Every day | ORAL | Status: DC
  Administered 2021-07-08 – 2021-07-10 (×3): 100 mg via ORAL
  Filled 2021-07-07 (×2): qty 2
  Filled 2021-07-07: qty 4

## 2021-07-07 MED ORDER — FOLIC ACID 1 MG PO TABS
500.0000 ug | ORAL_TABLET | ORAL | Status: DC
  Administered 2021-07-09: 0.5 mg via ORAL
  Filled 2021-07-07: qty 1

## 2021-07-07 MED ORDER — POLYVINYL ALCOHOL 1.4 % OP SOLN: 1.0000 [drp] | OPHTHALMIC | Status: DC | PRN

## 2021-07-07 MED ORDER — SERTRALINE HCL 50 MG PO TABS
50.0000 mg | ORAL_TABLET | Freq: Every day | ORAL | Status: DC
  Administered 2021-07-08 – 2021-07-10 (×3): 50 mg via ORAL
  Filled 2021-07-07 (×3): qty 1

## 2021-07-07 MED ORDER — ASCORBIC ACID 500 MG PO TABS
250.0000 mg | ORAL_TABLET | Freq: Every day | ORAL | Status: DC
  Administered 2021-07-08 – 2021-07-10 (×3): 250 mg via ORAL
  Filled 2021-07-07 (×3): qty 1

## 2021-07-07 MED ORDER — LORAZEPAM 1 MG PO TABS: 1.0000 mg | ORAL_TABLET | ORAL | Status: DC

## 2021-07-07 MED ORDER — POLYETHYLENE GLYCOL 3350 17 G PO PACK: 17.0000 g | PACK | Freq: Every day | ORAL | Status: DC | PRN

## 2021-07-07 MED ORDER — CHOLECALCIFEROL 10 MCG (400 UNIT) PO TABS
400.0000 [IU] | ORAL_TABLET | Freq: Every day | ORAL | Status: DC
  Administered 2021-07-08 – 2021-07-10 (×3): 400 [IU] via ORAL
  Filled 2021-07-07 (×3): qty 1

## 2021-07-07 MED ORDER — HYDROMORPHONE HCL 1 MG/ML IJ SOLN
0.5000 mg | INTRAMUSCULAR | Status: AC | PRN
  Administered 2021-07-07 (×2): 0.5 mg via INTRAVENOUS
  Filled 2021-07-07 (×2): qty 1

## 2021-07-07 NOTE — Progress Notes (Signed)
Consult received for displaced R femoral neck fracture. TR[facility] consulted for admission. COVID test pending. Plan for R THA tomorrow. NPO after MN. Will give OTO Lovenox now, then hold chemical DVT ppx until after surgery.  Full consult to follow.

## 2021-07-07 NOTE — Consult Note (Signed)
ORTHOPAEDIC CONSULTATION  REQUESTING PHYSICIAN: Swayze, Ava, DO  PCP:  Merri Brunette, MD  Chief Complaint: Right hip injury  HPI: Jessica Anthony is a 75 y.o. female with a PMHx of HTN and hypercholesterolemia who fell at home last night while trying to adjust the thermostat. She lives independently and does not any assist devices. TRH was consulted for admission. Orthopaedic consult placed for management of hip fracture. She denies other injuries.   Past Medical History:  Diagnosis Date   Detached retina    Hyperlipemia    Hypertension    History reviewed. No pertinent surgical history. Social History   Socioeconomic History   Marital status: Divorced    Spouse name: Not on file   Number of children: Not on file   Years of education: Not on file   Highest education level: Not on file  Occupational History   Not on file  Tobacco Use   Smoking status: Never   Smokeless tobacco: Not on file  Substance and Sexual Activity   Alcohol use: Yes    Alcohol/week: 0.0 standard drinks   Drug use: No   Sexual activity: Not on file  Other Topics Concern   Not on file  Social History Narrative   Not on file   Social Determinants of Health   Financial Resource Strain: Not on file  Food Insecurity: Not on file  Transportation Needs: Not on file  Physical Activity: Not on file  Stress: Not on file  Social Connections: Not on file   Family History  Problem Relation Age of Onset   Pancreatic cancer Mother    CVA Father    Kidney cancer Brother    Heart attack Brother 38   Hyperlipidemia Unknown    Hypertension Unknown    Allergies  Allergen Reactions   Statins     Muscle aches with all Statins   Prior to Admission medications   Medication Sig Start Date End Date Taking? Authorizing Provider  amLODipine (NORVASC) 5 MG tablet Take 5 mg by mouth daily. 05/11/21  Yes [provider]  Ascorbic Acid (VITAMIN C PO) Take 1 tablet by mouth daily.   Yes [provider]  fenofibrate 54 MG tablet Take 54 mg by mouth daily. 11/13/17  Yes [provider]  folic acid (FOLVITE) 400 MCG tablet Take 400 mcg by mouth 3 (three) times a week. No specific days   Yes [provider]  hydrochlorothiazide (HYDRODIURIL) 25 MG tablet Take 25 mg by mouth daily. 07/06/21  Yes [provider]  LORazepam (ATIVAN) 1 MG tablet Take 1 mg by mouth See admin instructions. 1mg  oral at bedtime 1mg  oral daily as needed for anxiety 12/15/14  Yes [provider]  losartan (COZAAR) 100 MG tablet Take 100 mg by mouth daily. 05/19/21  Yes [provider]  polyvinyl alcohol (LIQUIFILM TEARS) 1.4 % ophthalmic solution Place 1 drop into both eyes as needed for dry eyes.   Yes [provider]  pravastatin (PRAVACHOL) 40 MG tablet Take 40 mg by mouth daily. 12/28/17  Yes [provider]  sertraline (ZOLOFT) 50 MG tablet Take 50 mg by mouth daily. 07/06/21  Yes [provider]  triamcinolone cream (KENALOG) 0.1 % Apply 1 application topically 2 (two) times daily as needed for itching. 02/22/21  Yes [provider]  VITAMIN D PO Take 1 capsule by mouth daily.   Yes [provider]  losartan-hydrochlorothiazide (HYZAAR) 50-12.5 MG tablet Take 2 tablets by mouth daily.  Patient not taking: Reported on 07/07/2021 01/24/18   Linwood Dibbles, MD   DG Chest Port 1 View  Result Date: 07/07/2021 CLINICAL DATA:  Right hip fracture. EXAM: PORTABLE CHEST 1 VIEW COMPARISON:  None. FINDINGS: The heart size and mediastinal contours are within normal limits. Right lung is clear. Elevated left hemidiaphragm is noted with minimal left basilar subsegmental atelectasis or scarring. The visualized skeletal structures are unremarkable. IMPRESSION: Elevated left hemidiaphragm is noted with minimal left basilar subsegmental atelectasis or scarring. Electronically Signed   By: Lupita Raider M.D.   On: 07/07/2021 12:30   DG Knee  Right Port  Result Date: 07/07/2021 CLINICAL DATA:  Fall last night, on ischial encounter. EXAM: PORTABLE RIGHT KNEE - 1-2 VIEW COMPARISON:  None. FINDINGS: No joint effusion or fracture.  No degenerative changes. IMPRESSION: Negative. Electronically Signed   By: Leanna Battles M.D.   On: 07/07/2021 14:15   DG Hip Unilat With Pelvis 2-3 Views Right  Result Date: 07/07/2021 CLINICAL DATA:  Right hip pain after fall. EXAM: DG HIP (WITH OR WITHOUT PELVIS) 2-3V RIGHT COMPARISON:  None. FINDINGS: Moderately displaced proximal right femoral neck fracture is noted. IMPRESSION: Moderately displaced proximal right femoral neck fracture. Electronically Signed   By: Lupita Raider M.D.   On: 07/07/2021 12:29    Positive ROS: All other systems have been reviewed and were otherwise negative with the exception of those mentioned in the HPI and as above.  Physical Exam: General: Alert, no acute distress Cardiovascular: No pedal edema Respiratory: No cyanosis, no use of accessory musculature GI: No organomegaly, abdomen is soft and non-tender Skin: No lesions in the area of chief complaint Neurologic: Sensation intact distally Psychiatric: Patient is competent for consent with normal mood and affect Lymphatic: No axillary or cervical lymphadenopathy  MUSCULOSKELETAL:  R hip skin intact. (+) ecchymosis. Shortened and rotated. Unable to SLE. Pain with logroll. NVI.  Assessment: Displaced R femoral neck fx  Plan: Fracture requires surgical treatment for pain control and immediate mobilization. Discussed R/B/A of R THA. Plan for surgery tomorrow. Lovenox given today, hold chemical DVT ppx. NPO after MN.  The risks, benefits, and alternatives were discussed with the patient. There are risks associated with the surgery including, but not limited to, problems with anesthesia (death), infection, instability (giving out of the joint), dislocation, differences in leg length/angulation/rotation, fracture of  bones, loosening or failure of implants, hematoma (blood accumulation) which may require surgical drainage, blood clots, pulmonary embolism, nerve injury (foot drop and lateral thigh numbness), and blood vessel injury. The patient understands these risks and elects to proceed.   Jonette Pesa, MD [phone]    07/07/2021 10:24 PM

## 2021-07-07 NOTE — Assessment & Plan Note (Addendum)
Plan is for the patient to undergo ORIF with Dr. Veda Canning tomorrow. The patient is at increased risk of perioperative morbidity and mortality due to her advanced age, but she does not have history of heart or lung disease. She is at greater risk of morbidity and mortality to loss of mobility if the hip is not repaired than the procedure to repair it itself.

## 2021-07-07 NOTE — Assessment & Plan Note (Signed)
Continue prn ativan as at home.

## 2021-07-07 NOTE — ED Triage Notes (Addendum)
Pt BIB EMS from home, had a fall at 2AM, states she felt like she almost passed out but denies LOC. Able to get back to bed, lives by herself. Pt reports right thigh, rt hip pain, no bruising, pain only with movement. AO X 4, denies hitting head, no thinners, no neck/back pain.  VS per ems pta: 159/84 NSR= 100 100% RA

## 2021-07-07 NOTE — Assessment & Plan Note (Signed)
Continue zoloft as at home.

## 2021-07-07 NOTE — H&P (Signed)
Jessica Anthony is an 75 y.o. female.   Chief Complaint: Right hip pain. HPI: The patient is a 75 yr old woman who fell at home and had severe right hip pain.  In the ED she is found to have suffered a left femoral neck fracture.   The patient carries a medical history significant for hyperlipidemia and hypertension. She also has a history of a detached retina.   [facility] were consulted to admit the patient for further evaluation and treatment.  Dr. Veda Canning of orthopedic surgery has been consulted. He plans to take the patient for surgery tomorrow.  Past Medical History:  Diagnosis Date   Detached retina    Hyperlipemia    Hypertension     History reviewed. No pertinent surgical history.  Family History  Problem Relation Age of Onset   Pancreatic cancer Mother    CVA Father    Kidney cancer Brother    Heart attack Brother 80   Hyperlipidemia Unknown    Hypertension Unknown    Social History:  reports that she has never smoked. She does not have any smokeless tobacco history on file. She reports current alcohol use. She reports that she does not use drugs. (Not in a hospital admission)   Allergies:  Allergies  Allergen Reactions   Statins     Muscle aches with all Statins    Review of Systems - I have reviewed 12 systems with the patients. All are negative with exception for those elements included in HPI above.  Exam:  Constitutional:  The patient is awake, alert, and oriented x 3. No acute distress. Eyes:  pupils and irises appear normal Normal lids and conjunctivae ENMT:  grossly normal hearing  Lips appear normal external ears, nose appear normal Oropharynx: mucosa, tongue,posterior pharynx appear normal Neck:  neck appears normal, no masses, normal ROM, supple no thyromegaly Respiratory:  No increased work of breathing. No wheezes, rales, or rhonchi No tactile fremitus Cardiovascular:  Regular rate and rhythm No murmurs, ectopy, or gallups. No  lateral PMI. No thrills. Abdomen:  Abdomen is soft, non-tender, non-distended No hernias, masses, or organomegaly Normoactive bowel sounds.  Musculoskeletal:  No cyanosis, clubbing, or edema Skin:  No rashes, lesions, ulcers palpation of skin: no induration or nodules Neurologic:  CN 2-12 intact Sensation all 4 extremities intact Psychiatric:  Mental status Mood, affect appropriate Orientation to person, place, time  judgment and insight appear intact  Results for orders placed or performed during the hospital encounter of 07/07/21 (from the past 48 hour(s))  Basic metabolic panel     Status: Abnormal   Collection Time: 07/07/21 11:56 AM  Result Value Ref Range   Sodium 134 (L) 135 - 145 mmol/L   Potassium 3.5 3.5 - 5.1 mmol/L   Chloride 101 98 - 111 mmol/L   CO2 23 22 - 32 mmol/L   Glucose, Bld 128 (H) 70 - 99 mg/dL    Comment: Glucose reference range applies only to samples taken after fasting for at least 8 hours.   BUN 15 8 - 23 mg/dL   Creatinine, Ser 7.10 0.44 - 1.00 mg/dL   Calcium 9.0 8.9 - 62.6 mg/dL   GFR, Estimated >94 >85 mL/min    Comment: (NOTE) Calculated using the CKD-EPI Creatinine Equation (2021)    Anion gap 10 5 - 15    Comment: Performed at [facility], [address] [place], [place] [address]  CBC WITH DIFFERENTIAL     Status: Abnormal  Collection Time: 07/07/21 11:56 AM  Result Value Ref Range   WBC 13.3 (H) 4.0 - 10.5 K/uL   RBC 5.10 3.87 - 5.11 MIL/uL   Hemoglobin 14.5 12.0 - 15.0 g/dL   HCT 30.1 60.1 - 09.3 %   MCV 85.1 80.0 - 100.0 fL   MCH 28.4 26.0 - 34.0 pg   MCHC 33.4 30.0 - 36.0 g/dL   RDW 23.5 57.3 - 22.0 %   Platelets 290 150 - 400 K/uL   nRBC 0.0 0.0 - 0.2 %   Neutrophils Relative % 86 %   Neutro Abs 11.5 (H) 1.7 - 7.7 K/uL   Lymphocytes Relative 8 %   Lymphs Abs 1.1 0.7 - 4.0 K/uL   Monocytes Relative 5 %   Monocytes Absolute 0.7 0.1 - 1.0 K/uL   Eosinophils Relative 0 %   Eosinophils Absolute 0.0 0.0  - 0.5 K/uL   Basophils Relative 0 %   Basophils Absolute 0.0 0.0 - 0.1 K/uL   Immature Granulocytes 1 %   Abs Immature Granulocytes 0.08 (H) 0.00 - 0.07 K/uL    Comment: Performed at [facility] [address], [place], [place] [address]  Protime-INR     Status: None   Collection Time: 07/07/21 11:56 AM  Result Value Ref Range   Prothrombin Time 12.2 11.4 - 15.2 seconds   INR 0.9 0.8 - 1.2    Comment: (NOTE) INR goal varies based on device and disease states. Performed at [facility], [address] [place], [place] [address]   Type and screen [facility]     Status: None   Collection Time: 07/07/21 11:56 AM  Result Value Ref Range   ABO/RH(D) A POS    Antibody Screen NEG    Sample Expiration      07/10/2021,2359 Performed at [facility], [address] [place], [place] [address]   Resp Panel by RT-PCR (Flu A&B, Covid) Nasopharyngeal Swab     Status: None   Collection Time: 07/07/21  1:32 PM   Specimen: Nasopharyngeal Swab; Nasopharyngeal(NP) swabs in vial transport medium  Result Value Ref Range   SARS Coronavirus 2 by RT PCR NEGATIVE NEGATIVE    Comment: (NOTE) SARS-CoV-2 target nucleic acids are NOT DETECTED.  The SARS-CoV-2 RNA is generally detectable in upper respiratory specimens during the acute phase of infection. The lowest concentration of SARS-CoV-2 viral copies this assay can detect is 138 copies/mL. A negative result does not preclude SARS-Cov-2 infection and should not be used as the sole basis for treatment or other patient management decisions. A negative result may occur with  improper specimen collection/handling, submission of specimen other than nasopharyngeal swab, presence of viral mutation(s) within the areas targeted by this assay, and inadequate number of viral copies(<138 copies/mL). A negative result must be combined with clinical observations, patient history, and  epidemiological information. The expected result is Negative.  Fact Sheet for Patients:  BloggerCourse.com  Fact Sheet for Healthcare Providers:  SeriousBroker.it  This test is no t yet approved or cleared by the [place] FDA and  has been authorized for detection and/or diagnosis of SARS-CoV-2 by FDA under an Emergency Use Authorization (EUA). This EUA will remain  in effect (meaning this test can be used) for the duration of the COVID-19 declaration under Section 564(b)(1) of the Act, 21 U.S.C.section 360bbb-3(b)(1), unless the authorization is terminated  or revoked sooner.       Influenza A by PCR NEGATIVE NEGATIVE  Influenza B by PCR NEGATIVE NEGATIVE    Comment: (NOTE) The Xpert Xpress SARS-CoV-2/FLU/RSV plus assay is intended as an aid in the diagnosis of influenza from Nasopharyngeal swab specimens and should not be used as a sole basis for treatment. Nasal washings and aspirates are unacceptable for Xpert Xpress SARS-CoV-2/FLU/RSV testing.  Fact Sheet for Patients: BloggerCourse.com  Fact Sheet for Healthcare Providers: SeriousBroker.it  This test is not yet approved or cleared by the [place] FDA and has been authorized for detection and/or diagnosis of SARS-CoV-2 by FDA under an Emergency Use Authorization (EUA). This EUA will remain in effect (meaning this test can be used) for the duration of the COVID-19 declaration under Section 564(b)(1) of the Act, 21 U.S.C. section 360bbb-3(b)(1), unless the authorization is terminated or revoked.  Performed at [facility], [address] [place], [place] [address]    @[email or web address] @  Blood pressure 140/84, pulse 93, temperature 98.4 F (36.9 C), temperature source Oral, resp. rate 15, height 5\' 4"  (1.626 m), weight 54.4 kg, SpO2 91 %.    Assessment/Plan Problem  Femoral Neck Fracture  (Hcc)  Essential Hypertension  Anxiety  Depression  Hyperlipidemia  Depression Continue zoloft as at home.  Anxiety Continue prn ativan as at home.  Essential hypertension The patient will be continued on losartan and norvasc. HCTZ has not been continued as inpatient due to orthostatic hypotension is a common adverse reaction that will complicate rehab.  Femoral neck fracture (HCC) Plan is for the patient to undergo ORIF with Dr. tomorrow. The patient is at increased risk of perioperative morbidity and mortality due to her advanced age, but she does not have history of heart or lung disease. She is at greater risk of morbidity and mortality to loss of mobility if the hip is not repaired than the procedure to repair it itself.   I have seen and examined this patient myself. I have spent 72 minutes in her evaluation and care.  CODE STATUS: Full Code DVT Prophylaxis: Held pending surgery as per the request of orthopedic surgery Family Communication: None available. The patient's only relative is her son who lives in [place], Veda Canning. She is divorced and lives alone. Disposition: She will need rehab prior to discharge to home.  Sofia Vanmeter 07/07/2021, 6:51 PM

## 2021-07-07 NOTE — Assessment & Plan Note (Signed)
The patient will be continued on losartan and norvasc. HCTZ has not been continued as inpatient due to orthostatic hypotension is a common adverse reaction that will complicate rehab.

## 2021-07-07 NOTE — ED Notes (Addendum)
Pt O2 sats falling between 83-89%. Pt placed on 2 L O2 via [place].

## 2021-07-07 NOTE — Progress Notes (Signed)
PT Cancellation Note  Patient Details Name: Jessica Anthony MRN: [id number] DOB: 1946/01/26   Cancelled Treatment:    Reason Eval/Treat Not Completed: Medical issues which prohibited therapy Order received.  Pt admitted with R femoral neck fracture and plans for R THA with Dr. Linna Caprice tomorrow.  Will await reorder after surgery and sign off at this time.   [name] 07/07/2021, 3:52 PM Paulino Door, DPT Acute Rehabilitation Services Pager: [phone] Office: [phone]

## 2021-07-07 NOTE — ED Provider Notes (Signed)
[facility]EMERGENCY DEPT Provider Note   CSN: [id number] Arrival date & time: 07/07/21  1040     History Chief Complaint  Patient presents with   Jessica Anthony    Jessica Anthony is a 75 y.o. female.   Fall Pertinent negatives include no chest pain, no headaches and no shortness of breath.    Pt got up last night to adjust the temperature.  She thinks she may have gotten up too quickly because she felt lightheaded.  She end up falling on her right side.  Pt states the pain is severe in her hip down to her knee.  She was able to crawl and get back into bed.  She continues to have pain.  She was not able to move.  No LOC>  Past Medical History:  Diagnosis Date   Detached retina    Hyperlipemia    Hypertension     Patient Active Problem List   Diagnosis Date Noted   Hyperlipidemia 12/19/2014    History reviewed. No pertinent surgical history.   OB History   No obstetric history on file.     Family History  Problem Relation Age of Onset   Pancreatic cancer Mother    CVA Father    Kidney cancer Brother    Heart attack Brother 11   Hyperlipidemia Unknown    Hypertension Unknown     Social History   Tobacco Use   Smoking status: Never  Substance Use Topics   Alcohol use: Yes    Alcohol/week: 0.0 standard drinks   Drug use: No    Home Medications Prior to Admission medications   Medication Sig Start Date End Date Taking? Authorizing Provider  fenofibrate 54 MG tablet Take 54 mg by mouth daily. 11/13/17   [provider]  folic acid (FOLVITE) 400 MCG tablet Take 400 mcg by mouth daily.    [provider]  LORazepam (ATIVAN) 1 MG tablet Take 1 tablet by mouth before bedtime as needed for insomnia 12/15/14   [provider]  losartan-hydrochlorothiazide (HYZAAR) 50-12.5 MG tablet Take 2 tablets by mouth daily. 01/24/18   Linwood Dibbles, MD  Multiple Minerals-Vitamins (CALCIUM-MAGNESIUM-ZINC-D3 PO) Take 1 tablet by mouth 2 (two)  times daily.    [provider]  Multiple Vitamins-Minerals (MULTIVITAMIN ADULT PO) Take 1 tablet by mouth daily.    [provider]  pravastatin (PRAVACHOL) 40 MG tablet Take 40 mg by mouth daily. 12/28/17   [provider]  sertraline (ZOLOFT) 25 MG tablet Take 25 mg by mouth daily. 01/22/18   [provider]    Allergies    Statins  Review of Systems   Review of Systems  Constitutional:  Negative for fever.  Respiratory:  Negative for shortness of breath.   Cardiovascular:  Negative for chest pain.  Gastrointestinal:  Negative for diarrhea and vomiting.  Musculoskeletal:  Negative for back pain.  Neurological:  Negative for syncope and headaches.  All other systems reviewed and are negative.  Physical Exam Updated Vital Signs BP 136/73   Pulse 94   Temp 98.4 F (36.9 C) (Oral)   Resp 14   Ht 1.626 m (5\' 4" )   Wt 54.4 kg   SpO2 92%   BMI 20.60 kg/m   Physical Exam Vitals and nursing note reviewed.  Constitutional:      General: She is not in acute distress.    Appearance: She is well-developed.  HENT:     Head: Normocephalic and atraumatic.  Right Ear: External ear normal.     Left Ear: External ear normal.  Eyes:     General: No scleral icterus.       Right eye: No discharge.        Left eye: No discharge.     Conjunctiva/sclera: Conjunctivae normal.  Neck:     Trachea: No tracheal deviation.  Cardiovascular:     Rate and Rhythm: Normal rate and regular rhythm.  Pulmonary:     Effort: Pulmonary effort is normal. No respiratory distress.     Breath sounds: Normal breath sounds. No stridor. No wheezing or rales.  Abdominal:     General: Bowel sounds are normal. There is no distension.     Palpations: Abdomen is soft.     Tenderness: There is no abdominal tenderness. There is no guarding or rebound.  Musculoskeletal:        General: Tenderness present. No deformity.     Cervical back: Normal and neck supple.     Thoracic  back: Normal.     Lumbar back: Normal.     Right hip: Tenderness present. Decreased range of motion.     Right knee: Normal.     Right lower leg: Normal.     Comments: Shortening RLW, held in flexion  Skin:    General: Skin is warm and dry.     Findings: No rash.  Neurological:     General: No focal deficit present.     Mental Status: She is alert.     Cranial Nerves: No cranial nerve deficit (no facial droop, extraocular movements intact, no slurred speech).     Sensory: No sensory deficit.     Motor: No abnormal muscle tone or seizure activity.     Coordination: Coordination normal.  Psychiatric:        Mood and Affect: Mood normal.    ED Results / Procedures / Treatments   Labs (all labs ordered are listed, but only abnormal results are displayed) Labs Reviewed  BASIC METABOLIC PANEL - Abnormal; Notable for the following components:      Result Value   Sodium 134 (*)    Glucose, Bld 128 (*)    All other components within normal limits  CBC WITH DIFFERENTIAL/PLATELET - Abnormal; Notable for the following components:   WBC 13.3 (*)    Neutro Abs 11.5 (*)    Abs Immature Granulocytes 0.08 (*)    All other components within normal limits  RESP PANEL BY RT-PCR (FLU A&B, COVID) [id number]  PROTIME-INR  TYPE AND SCREEN    EKG EKG Interpretation  Date/Time:  Wednesday July 07 2021 12:06:42 EST Ventricular Rate:  93 PR Interval:  166 QRS Duration: 97 QT Interval:  384 QTC Calculation: 478 R Axis:   60 Text Interpretation: Sinus rhythm Biatrial enlargement RSR' in V1 or V2, right VCD or RVH Confirmed by Linwood Dibbles [phone]) on 07/07/2021 12:58:47 PM  Radiology DG Chest Port 1 View  Result Date: 07/07/2021 CLINICAL DATA:  Right hip fracture. EXAM: PORTABLE CHEST 1 VIEW COMPARISON:  None. FINDINGS: The heart size and mediastinal contours are within normal limits. Right lung is clear. Elevated left hemidiaphragm is noted with minimal left basilar subsegmental atelectasis or  scarring. The visualized skeletal structures are unremarkable. IMPRESSION: Elevated left hemidiaphragm is noted with minimal left basilar subsegmental atelectasis or scarring. Electronically Signed   By: Lupita Raider M.D.   On: 07/07/2021 12:30   DG Knee Right Port  Result Date: 07/07/2021 CLINICAL DATA:  Fall last night, on ischial encounter. EXAM: PORTABLE RIGHT KNEE - 1-2 VIEW COMPARISON:  None. FINDINGS: No joint effusion or fracture.  No degenerative changes. IMPRESSION: Negative. Electronically Signed   By: Leanna Battles M.D.   On: 07/07/2021 14:15   DG Hip Unilat With Pelvis 2-3 Views Right  Result Date: 07/07/2021 CLINICAL DATA:  Right hip pain after fall. EXAM: DG HIP (WITH OR WITHOUT PELVIS) 2-3V RIGHT COMPARISON:  None. FINDINGS: Moderately displaced proximal right femoral neck fracture is noted. IMPRESSION: Moderately displaced proximal right femoral neck fracture. Electronically Signed   By: Lupita Raider M.D.   On: 07/07/2021 12:29    Procedures Procedures   Medications Ordered in ED Medications  0.9 %  sodium chloride infusion ( Intravenous New Bag/Given 07/07/21 1201)  HYDROmorphone (DILAUDID) injection 0.5 mg (0.5 mg Intravenous Given 07/07/21 1405)  ondansetron (ZOFRAN) injection 4 mg (4 mg Intravenous Given 07/07/21 1202)  enoxaparin (LOVENOX) injection 30 mg (30 mg Subcutaneous Given 07/07/21 1405)    ED Course  I have reviewed the triage vital signs and the nursing notes.  Pertinent labs & imaging results that were available during my care of the patient were reviewed by me and considered in my medical decision making (see chart for details).  Clinical Course as of 07/07/21 1426  Wed Jul 07, 2021  1259 CBC and metabolic panel notable for leukocytosis but I doubt this is clinically significant [JK]  1300 X-ray does show a femoral neck fracture [JK]  1332 Case discussed with Dr. Linna Caprice.  Plan would be for OR tomorrow.  N.p.o. after midnight [JK]     Clinical Course User Index [JK] Linwood Dibbles, MD   MDM Rules/Calculators/A&P                           Patient presented to the ED for evaluation after mechanical fall.  Patient was able to get herself back up in the bed but has significant difficulty.  Patient was unable to walk this morning.  On exam she has notable deformity of her right hip.  No other injuries noted.  ED work-up does show a right femoral neck fracture.  Patient appears otherwise medically stable.  I have consulted with Dr. Linna Caprice orthopedics.  Also spoke with Dr. Gerri Lins hospitalist Final Clinical Impression(s) / ED Diagnoses Final diagnoses:  Closed fracture of right hip, initial encounter [facility])     Linwood Dibbles, MD 07/07/21 1426

## 2021-07-07 NOTE — H&P (View-Only) (Signed)
ORTHOPAEDIC CONSULTATION  REQUESTING PHYSICIAN: Swayze, Ava, DO  PCP:  Merri Brunette, MD  Chief Complaint: Right hip injury  HPI: Jessica Anthony is a 75 y.o. female with a PMHx of HTN and hypercholesterolemia who fell at home last night while trying to adjust the thermostat. She lives independently and does not any assist devices. [facility] was consulted for admission. Orthopaedic consult placed for management of hip fracture. She denies other injuries.   Past Medical History:  Diagnosis Date   Detached retina    Hyperlipemia    Hypertension    History reviewed. No pertinent surgical history. Social History   Socioeconomic History   Marital status: Divorced    Spouse name: Not on file   Number of children: Not on file   Years of education: Not on file   Highest education level: Not on file  Occupational History   Not on file  Tobacco Use   Smoking status: Never   Smokeless tobacco: Not on file  Substance and Sexual Activity   Alcohol use: Yes    Alcohol/week: 0.0 standard drinks   Drug use: No   Sexual activity: Not on file  Other Topics Concern   Not on file  Social History Narrative   Not on file   Social Determinants of Health   Financial Resource Strain: Not on file  Food Insecurity: Not on file  Transportation Needs: Not on file  Physical Activity: Not on file  Stress: Not on file  Social Connections: Not on file   Family History  Problem Relation Age of Onset   Pancreatic cancer Mother    CVA Father    Kidney cancer Brother    Heart attack Brother 74   Hyperlipidemia Unknown    Hypertension Unknown    Allergies  Allergen Reactions   Statins     Muscle aches with all Statins   Prior to Admission medications   Medication Sig Start Date End Date Taking? Authorizing Provider  amLODipine (NORVASC) 5 MG tablet Take 5 mg by mouth daily. 05/11/21  Yes [provider]  Ascorbic Acid (VITAMIN C PO) Take 1 tablet by mouth daily.   Yes [provider]  fenofibrate 54 MG tablet Take 54 mg by mouth daily. 11/13/17  Yes [provider]  folic acid (FOLVITE) 400 MCG tablet Take 400 mcg by mouth 3 (three) times a week. No specific days   Yes [provider]  hydrochlorothiazide (HYDRODIURIL) 25 MG tablet Take 25 mg by mouth daily. 07/06/21  Yes [provider]  LORazepam (ATIVAN) 1 MG tablet Take 1 mg by mouth See admin instructions. 1mg  oral at bedtime 1mg  oral daily as needed for anxiety 12/15/14  Yes [provider]  losartan (COZAAR) 100 MG tablet Take 100 mg by mouth daily. 05/19/21  Yes [provider]  polyvinyl alcohol (LIQUIFILM TEARS) 1.4 % ophthalmic solution Place 1 drop into both eyes as needed for dry eyes.   Yes [provider]  pravastatin (PRAVACHOL) 40 MG tablet Take 40 mg by mouth daily. 12/28/17  Yes [provider]  sertraline (ZOLOFT) 50 MG tablet Take 50 mg by mouth daily. 07/06/21  Yes [provider]  triamcinolone cream (KENALOG) 0.1 % Apply 1 application topically 2 (two) times daily as needed for itching. 02/22/21  Yes [provider]  VITAMIN D PO Take 1 capsule by mouth daily.   Yes [provider]  losartan-hydrochlorothiazide (HYZAAR) 50-12.5 MG tablet Take 2 tablets by mouth daily.  Patient not taking: Reported on 07/07/2021 01/24/18   Linwood Dibbles, MD   DG Chest Port 1 View  Result Date: 07/07/2021 CLINICAL DATA:  Right hip fracture. EXAM: PORTABLE CHEST 1 VIEW COMPARISON:  None. FINDINGS: The heart size and mediastinal contours are within normal limits. Right lung is clear. Elevated left hemidiaphragm is noted with minimal left basilar subsegmental atelectasis or scarring. The visualized skeletal structures are unremarkable. IMPRESSION: Elevated left hemidiaphragm is noted with minimal left basilar subsegmental atelectasis or scarring. Electronically Signed   By: Lupita Raider M.D.   On: 07/07/2021 12:30   DG Knee  Right Port  Result Date: 07/07/2021 CLINICAL DATA:  Fall last night, on ischial encounter. EXAM: PORTABLE RIGHT KNEE - 1-2 VIEW COMPARISON:  None. FINDINGS: No joint effusion or fracture.  No degenerative changes. IMPRESSION: Negative. Electronically Signed   By: Leanna Battles M.D.   On: 07/07/2021 14:15   DG Hip Unilat With Pelvis 2-3 Views Right  Result Date: 07/07/2021 CLINICAL DATA:  Right hip pain after fall. EXAM: DG HIP (WITH OR WITHOUT PELVIS) 2-3V RIGHT COMPARISON:  None. FINDINGS: Moderately displaced proximal right femoral neck fracture is noted. IMPRESSION: Moderately displaced proximal right femoral neck fracture. Electronically Signed   By: Lupita Raider M.D.   On: 07/07/2021 12:29    Positive ROS: All other systems have been reviewed and were otherwise negative with the exception of those mentioned in the HPI and as above.  Physical Exam: General: Alert, no acute distress Cardiovascular: No pedal edema Respiratory: No cyanosis, no use of accessory musculature GI: No organomegaly, abdomen is soft and non-tender Skin: No lesions in the area of chief complaint Neurologic: Sensation intact distally Psychiatric: Patient is competent for consent with normal mood and affect Lymphatic: No axillary or cervical lymphadenopathy  MUSCULOSKELETAL:  R hip skin intact. (+) ecchymosis. Shortened and rotated. Unable to SLE. Pain with logroll. NVI.  Assessment: Displaced R femoral neck fx  Plan: Fracture requires surgical treatment for pain control and immediate mobilization. Discussed R/B/A of R THA. Plan for surgery tomorrow. Lovenox given today, hold chemical DVT ppx. NPO after MN.  The risks, benefits, and alternatives were discussed with the patient. There are risks associated with the surgery including, but not limited to, problems with anesthesia (death), infection, instability (giving out of the joint), dislocation, differences in leg length/angulation/rotation, fracture of  bones, loosening or failure of implants, hematoma (blood accumulation) which may require surgical drainage, blood clots, pulmonary embolism, nerve injury (foot drop and lateral thigh numbness), and blood vessel injury. The patient understands these risks and elects to proceed.   Jonette Pesa, MD [phone]    07/07/2021 10:24 PM

## 2021-07-08 ENCOUNTER — Inpatient Hospital Stay (HOSPITAL_COMMUNITY): Payer: Medicare Other

## 2021-07-08 ENCOUNTER — Inpatient Hospital Stay (HOSPITAL_COMMUNITY): Payer: Medicare Other | Admitting: Anesthesiology

## 2021-07-08 ENCOUNTER — Encounter (HOSPITAL_COMMUNITY)
Admission: EM | Disposition: A | Discharge status: Home / Self Care | Payer: Self-pay | Source: Home / Self Care | Attending: Internal Medicine

## 2021-07-08 ENCOUNTER — Encounter (HOSPITAL_COMMUNITY): Payer: Self-pay | Admitting: Internal Medicine

## 2021-07-08 HISTORY — PX: TOTAL HIP ARTHROPLASTY: SHX124

## 2021-07-08 LAB — CBC
HCT: 41 % (ref 36.0–46.0)
Hemoglobin: 13.4 g/dL (ref 12.0–15.0)
MCH: 28.9 pg (ref 26.0–34.0)
MCHC: 32.7 g/dL (ref 30.0–36.0)
MCV: 88.4 fL (ref 80.0–100.0)
Platelets: 221 10*3/uL (ref 150–400)
RBC: 4.64 MIL/uL (ref 3.87–5.11)
RDW: 13.6 % (ref 11.5–15.5)
WBC: 7.8 10*3/uL (ref 4.0–10.5)
nRBC: 0 % (ref 0.0–0.2)

## 2021-07-08 LAB — BASIC METABOLIC PANEL
Anion gap: 4 — ABNORMAL LOW (ref 5–15)
BUN: 11 mg/dL (ref 8–23)
CO2: 25 mmol/L (ref 22–32)
Calcium: 8.3 mg/dL — ABNORMAL LOW (ref 8.9–10.3)
Chloride: 106 mmol/L (ref 98–111)
Creatinine, Ser: 0.6 mg/dL (ref 0.44–1.00)
GFR, Estimated: >60 mL/min (ref >60)
Glucose, Bld: 122 mg/dL — ABNORMAL HIGH (ref 70–99)
Potassium: 3.2 mmol/L — ABNORMAL LOW (ref 3.5–5.1)
Sodium: 135 mmol/L (ref 135–145)

## 2021-07-08 LAB — ABO/RH: ABO/RH(D): A POS

## 2021-07-08 SURGERY — ARTHROPLASTY, HIP, TOTAL, ANTERIOR APPROACH
Anesthesia: Monitor Anesthesia Care | Site: Hip | Laterality: Right

## 2021-07-08 MED ORDER — FENTANYL CITRATE PF 50 MCG/ML IJ SOSY: 25.0000 ug | PREFILLED_SYRINGE | INTRAMUSCULAR | Status: DC | PRN

## 2021-07-08 MED ORDER — PHENYLEPHRINE HCL-NACL 20-0.9 MG/250ML-% IV SOLN
INTRAVENOUS | Status: DC | PRN
  Administered 2021-07-08: 20 ug/min via INTRAVENOUS

## 2021-07-08 MED ORDER — PHENYLEPHRINE HCL (PRESSORS) 10 MG/ML IV SOLN
INTRAVENOUS | Status: AC
  Filled 2021-07-08: qty 1

## 2021-07-08 MED ORDER — ALBUMIN HUMAN 5 % IV SOLN: INTRAVENOUS | Status: DC | PRN

## 2021-07-08 MED ORDER — ONDANSETRON HCL 4 MG/2ML IJ SOLN
INTRAMUSCULAR | Status: AC
  Filled 2021-07-08: qty 2

## 2021-07-08 MED ORDER — MENTHOL 3 MG MT LOZG: 1.0000 | LOZENGE | OROMUCOSAL | Status: DC | PRN

## 2021-07-08 MED ORDER — SENNA 8.6 MG PO TABS
1.0000 | ORAL_TABLET | Freq: Two times a day (BID) | ORAL | Status: DC
  Administered 2021-07-09 – 2021-07-10 (×3): 8.6 mg via ORAL
  Filled 2021-07-08 (×3): qty 1

## 2021-07-08 MED ORDER — ISOPROPYL ALCOHOL 70 % SOLN
Status: DC | PRN
  Administered 2021-07-08: 1 via TOPICAL

## 2021-07-08 MED ORDER — FENTANYL CITRATE (PF) 100 MCG/2ML IJ SOLN
INTRAMUSCULAR | Status: DC | PRN
  Administered 2021-07-08: 50 ug via INTRAVENOUS

## 2021-07-08 MED ORDER — ACETAMINOPHEN 500 MG PO TABS
ORAL_TABLET | ORAL | Status: AC
  Administered 2021-07-08: 1000 mg via ORAL
  Filled 2021-07-08: qty 2

## 2021-07-08 MED ORDER — BUPIVACAINE IN DEXTROSE 0.75-8.25 % IT SOLN
INTRATHECAL | Status: DC | PRN
  Administered 2021-07-08: 1.6 mL via INTRATHECAL

## 2021-07-08 MED ORDER — ONDANSETRON HCL 4 MG/2ML IJ SOLN: 4.0000 mg | Freq: Four times a day (QID) | INTRAMUSCULAR | Status: DC | PRN

## 2021-07-08 MED ORDER — CHLORHEXIDINE GLUCONATE 0.12 % MT SOLN
15.0000 mL | Freq: Once | OROMUCOSAL | Status: AC
  Administered 2021-07-08: 15 mL via OROMUCOSAL

## 2021-07-08 MED ORDER — CELECOXIB 200 MG PO CAPS: 200.0000 mg | ORAL_CAPSULE | Freq: Once | ORAL | Status: AC

## 2021-07-08 MED ORDER — PROMETHAZINE HCL 25 MG/ML IJ SOLN
6.2500 mg | INTRAMUSCULAR | Status: DC | PRN
Start: 2021-07-08 — End: 2021-07-08

## 2021-07-08 MED ORDER — METOCLOPRAMIDE HCL 5 MG/ML IJ SOLN: 5.0000 mg | Freq: Three times a day (TID) | INTRAMUSCULAR | Status: DC | PRN

## 2021-07-08 MED ORDER — TRANEXAMIC ACID-NACL 1000-0.7 MG/100ML-% IV SOLN
1000.0000 mg | INTRAVENOUS | Status: AC
  Administered 2021-07-08: 1000 mg via INTRAVENOUS

## 2021-07-08 MED ORDER — ONDANSETRON HCL 4 MG PO TABS: 4.0000 mg | ORAL_TABLET | Freq: Four times a day (QID) | ORAL | Status: DC | PRN

## 2021-07-08 MED ORDER — PROPOFOL 500 MG/50ML IV EMUL
INTRAVENOUS | Status: DC | PRN
  Administered 2021-07-08: 50 ug/kg/min via INTRAVENOUS

## 2021-07-08 MED ORDER — PHENOL 1.4 % MT LIQD: 1.0000 | OROMUCOSAL | Status: DC | PRN

## 2021-07-08 MED ORDER — DEXAMETHASONE SODIUM PHOSPHATE 10 MG/ML IJ SOLN
INTRAMUSCULAR | Status: AC
  Filled 2021-07-08: qty 1

## 2021-07-08 MED ORDER — METHOCARBAMOL 500 MG IVPB - SIMPLE MED
500.0000 mg | Freq: Four times a day (QID) | INTRAVENOUS | Status: DC | PRN
  Filled 2021-07-08: qty 50

## 2021-07-08 MED ORDER — DOCUSATE SODIUM 100 MG PO CAPS
100.0000 mg | ORAL_CAPSULE | Freq: Two times a day (BID) | ORAL | Status: DC
  Administered 2021-07-08 – 2021-07-10 (×4): 100 mg via ORAL
  Filled 2021-07-08 (×4): qty 1

## 2021-07-08 MED ORDER — DEXAMETHASONE SODIUM PHOSPHATE 10 MG/ML IJ SOLN
INTRAMUSCULAR | Status: DC | PRN
  Administered 2021-07-08: 4 mg via INTRAVENOUS

## 2021-07-08 MED ORDER — CEFAZOLIN SODIUM-DEXTROSE 2-4 GM/100ML-% IV SOLN
INTRAVENOUS | Status: AC
  Filled 2021-07-08: qty 100

## 2021-07-08 MED ORDER — BUPIVACAINE-EPINEPHRINE (PF) 0.25% -1:200000 IJ SOLN
INTRAMUSCULAR | Status: AC
  Filled 2021-07-08: qty 30

## 2021-07-08 MED ORDER — SODIUM CHLORIDE 0.9 % IR SOLN
Status: DC | PRN
  Administered 2021-07-08: 2000 mL

## 2021-07-08 MED ORDER — CHLORHEXIDINE GLUCONATE CLOTH 2 % EX PADS
6.0000 | MEDICATED_PAD | Freq: Every day | CUTANEOUS | Status: DC
  Administered 2021-07-09: 6 via TOPICAL

## 2021-07-08 MED ORDER — TRANEXAMIC ACID-NACL 1000-0.7 MG/100ML-% IV SOLN
INTRAVENOUS | Status: AC
  Filled 2021-07-08: qty 100

## 2021-07-08 MED ORDER — ASPIRIN EC 325 MG PO TBEC
325.0000 mg | DELAYED_RELEASE_TABLET | Freq: Every day | ORAL | Status: DC
  Administered 2021-07-09 – 2021-07-10 (×2): 325 mg via ORAL
  Filled 2021-07-08 (×2): qty 1

## 2021-07-08 MED ORDER — FENTANYL CITRATE (PF) 100 MCG/2ML IJ SOLN
INTRAMUSCULAR | Status: AC
  Filled 2021-07-08: qty 2

## 2021-07-08 MED ORDER — KETOROLAC TROMETHAMINE 30 MG/ML IJ SOLN
INTRAMUSCULAR | Status: DC | PRN
  Administered 2021-07-08: 30 mg via INTRAMUSCULAR

## 2021-07-08 MED ORDER — POVIDONE-IODINE 10 % EX SWAB
2.0000 "application " | Freq: Once | CUTANEOUS | Status: AC
  Administered 2021-07-08: 2 via TOPICAL

## 2021-07-08 MED ORDER — CEFAZOLIN SODIUM-DEXTROSE 2-4 GM/100ML-% IV SOLN
2.0000 g | Freq: Four times a day (QID) | INTRAVENOUS | Status: AC
  Administered 2021-07-08 – 2021-07-09 (×2): 2 g via INTRAVENOUS
  Filled 2021-07-08 (×2): qty 100

## 2021-07-08 MED ORDER — PHENYLEPHRINE HCL (PRESSORS) 10 MG/ML IV SOLN
INTRAVENOUS | Status: DC | PRN
  Administered 2021-07-08: 80 ug via INTRAVENOUS
  Administered 2021-07-08: 40 ug via INTRAVENOUS

## 2021-07-08 MED ORDER — PROPOFOL 10 MG/ML IV BOLUS
INTRAVENOUS | Status: DC | PRN
  Administered 2021-07-08: 20 mg via INTRAVENOUS
  Administered 2021-07-08: 40 mg via INTRAVENOUS

## 2021-07-08 MED ORDER — CEFAZOLIN SODIUM-DEXTROSE 2-4 GM/100ML-% IV SOLN
2.0000 g | INTRAVENOUS | Status: AC
  Administered 2021-07-08: 2 g via INTRAVENOUS

## 2021-07-08 MED ORDER — METHOCARBAMOL 500 MG PO TABS
500.0000 mg | ORAL_TABLET | Freq: Four times a day (QID) | ORAL | Status: DC | PRN
  Administered 2021-07-09 – 2021-07-10 (×2): 500 mg via ORAL
  Filled 2021-07-08 (×2): qty 1

## 2021-07-08 MED ORDER — METOCLOPRAMIDE HCL 5 MG PO TABS: 5.0000 mg | ORAL_TABLET | Freq: Three times a day (TID) | ORAL | Status: DC | PRN

## 2021-07-08 MED ORDER — KETOROLAC TROMETHAMINE 30 MG/ML IJ SOLN
INTRAMUSCULAR | Status: AC
  Filled 2021-07-08: qty 1

## 2021-07-08 MED ORDER — WATER FOR IRRIGATION, STERILE IR SOLN
Status: DC | PRN
  Administered 2021-07-08: 2000 mL

## 2021-07-08 MED ORDER — TRANEXAMIC ACID-NACL 1000-0.7 MG/100ML-% IV SOLN
1000.0000 mg | Freq: Once | INTRAVENOUS | Status: AC
  Administered 2021-07-08: 1000 mg via INTRAVENOUS
  Filled 2021-07-08: qty 100

## 2021-07-08 MED ORDER — POVIDONE-IODINE 10 % EX SWAB: 2.0000 "application " | Freq: Once | CUTANEOUS | Status: AC

## 2021-07-08 MED ORDER — ACETAMINOPHEN 500 MG PO TABS: 1000.0000 mg | ORAL_TABLET | Freq: Once | ORAL | Status: AC

## 2021-07-08 MED ORDER — CELECOXIB 200 MG PO CAPS
ORAL_CAPSULE | ORAL | Status: AC
  Administered 2021-07-08: 200 mg via ORAL
  Filled 2021-07-08: qty 1

## 2021-07-08 MED ORDER — BUPIVACAINE-EPINEPHRINE 0.25% -1:200000 IJ SOLN
INTRAMUSCULAR | Status: DC | PRN
  Administered 2021-07-08: 30 mL

## 2021-07-08 MED ORDER — SODIUM CHLORIDE (PF) 0.9 % IJ SOLN
INTRAMUSCULAR | Status: DC | PRN
  Administered 2021-07-08: 30 mL

## 2021-07-08 MED ORDER — LACTATED RINGERS IV SOLN: INTRAVENOUS | Status: DC

## 2021-07-08 MED ORDER — ONDANSETRON HCL 4 MG/2ML IJ SOLN
INTRAMUSCULAR | Status: DC | PRN
  Administered 2021-07-08: 4 mg via INTRAVENOUS

## 2021-07-08 MED ORDER — CHLORHEXIDINE GLUCONATE 4 % EX LIQD
60.0000 mL | Freq: Once | CUTANEOUS | Status: AC
  Administered 2021-07-08: 4 via TOPICAL

## 2021-07-08 MED ORDER — SODIUM CHLORIDE (PF) 0.9 % IJ SOLN
INTRAMUSCULAR | Status: AC
  Filled 2021-07-08: qty 30

## 2021-07-08 MED ORDER — AMISULPRIDE (ANTIEMETIC) 5 MG/2ML IV SOLN: 10.0000 mg | Freq: Once | INTRAVENOUS | Status: DC | PRN

## 2021-07-08 SURGICAL SUPPLY — 65 items
ADH SKN CLS APL DERMABOND .7 (GAUZE/BANDAGES/DRESSINGS) ×1
APL PRP STRL LF DISP 70% ISPRP (MISCELLANEOUS) ×1
BAG COUNTER SPONGE SURGICOUNT (BAG) IMPLANT
BAG DECANTER FOR FLEXI CONT (MISCELLANEOUS) ×1 IMPLANT
BAG SPEC THK2 15X12 ZIP CLS (MISCELLANEOUS)
BAG SPNG CNTER NS LX DISP (BAG)
BAG ZIPLOCK 12X15 (MISCELLANEOUS) IMPLANT
CABLE CERLAGE W/CRIMP 1.8 (Cable) ×1 IMPLANT
CHLORAPREP W/TINT 26 (MISCELLANEOUS) ×2 IMPLANT
COVER PERINEAL POST (MISCELLANEOUS) ×2 IMPLANT
COVER SURGICAL LIGHT HANDLE (MISCELLANEOUS) ×2 IMPLANT
DECANTER SPIKE VIAL GLASS SM (MISCELLANEOUS) ×2 IMPLANT
DERMABOND ADVANCED (GAUZE/BANDAGES/DRESSINGS) ×1
DERMABOND ADVANCED .7 DNX12 (GAUZE/BANDAGES/DRESSINGS) ×2 IMPLANT
DRAPE IMP U-DRAPE 54X76 (DRAPES) ×2 IMPLANT
DRAPE SHEET LG 3/4 BI-LAMINATE (DRAPES) ×6 IMPLANT
DRAPE STERI IOBAN 125X83 (DRAPES) ×2 IMPLANT
DRAPE U-SHAPE 47X51 STRL (DRAPES) ×4 IMPLANT
DRESSING AQUACEL AG SP 3.5X10 (GAUZE/BANDAGES/DRESSINGS) IMPLANT
DRSG AQUACEL AG ADV 3.5X10 (GAUZE/BANDAGES/DRESSINGS) ×2 IMPLANT
DRSG AQUACEL AG SP 3.5X10 (GAUZE/BANDAGES/DRESSINGS) ×2
ELECT REM PT RETURN 15FT ADLT (MISCELLANEOUS) ×2 IMPLANT
GAUZE SPONGE 4X4 12PLY STRL (GAUZE/BANDAGES/DRESSINGS) ×2 IMPLANT
GLOVE SRG 8 PF TXTR STRL LF DI (GLOVE) ×1 IMPLANT
GLOVE SURG ENC MOIS LTX SZ8.5 (GLOVE) ×4 IMPLANT
GLOVE SURG ENC TEXT LTX SZ7.5 (GLOVE) ×4 IMPLANT
GLOVE SURG UNDER POLY LF SZ8 (GLOVE) ×2
GLOVE SURG UNDER POLY LF SZ8.5 (GLOVE) ×2 IMPLANT
GOWN SPEC L3 XXLG W/TWL (GOWN DISPOSABLE) ×2 IMPLANT
GOWN STRL REUS W/TWL XL LVL3 (GOWN DISPOSABLE) ×2 IMPLANT
HANDPIECE INTERPULSE COAX TIP (DISPOSABLE) ×2
HEAD FEM -3XOFST 36XMDLR (Head) IMPLANT
HEAD MODULAR 36MM (Head) ×2 IMPLANT
HOLDER FOLEY CATH W/STRAP (MISCELLANEOUS) ×2 IMPLANT
HOOD PEEL AWAY FLYTE STAYCOOL (MISCELLANEOUS) ×8 IMPLANT
JET LAVAGE IRRISEPT WOUND (IRRIGATION / IRRIGATOR) ×2
KIT TURNOVER KIT A (KITS) IMPLANT
LAVAGE JET IRRISEPT WOUND (IRRIGATION / IRRIGATOR) ×1 IMPLANT
LINER ACETAB ~~LOC~~ D 36 (Liner) ×1 IMPLANT
MANIFOLD NEPTUNE II (INSTRUMENTS) ×2 IMPLANT
MARKER SKIN DUAL TIP RULER LAB (MISCELLANEOUS) ×2 IMPLANT
NDL SAFETY ECLIPSE 18X1.5 (NEEDLE) ×1 IMPLANT
NDL SPNL 18GX3.5 QUINCKE PK (NEEDLE) ×1 IMPLANT
NEEDLE HYPO 18GX1.5 SHARP (NEEDLE) ×2
NEEDLE SPNL 18GX3.5 QUINCKE PK (NEEDLE) ×2 IMPLANT
PACK ANTERIOR HIP CUSTOM (KITS) ×2 IMPLANT
PENCIL SMOKE EVACUATOR (MISCELLANEOUS) IMPLANT
SAW OSC TIP CART 19.5X105X1.3 (SAW) ×2 IMPLANT
SEALER BIPOLAR AQUA 6.0 (INSTRUMENTS) ×2 IMPLANT
SET HNDPC FAN SPRY TIP SCT (DISPOSABLE) ×1 IMPLANT
SHELL ACET G7 3H 50 SZD (Shell) ×1 IMPLANT
STAPLER INSORB 30 2030 C-SECTI (MISCELLANEOUS) IMPLANT
STAPLER VISISTAT 35W (STAPLE) ×1 IMPLANT
STEM FEM CEMLS SZ 13 133D (Stem) ×1 IMPLANT
SUT MNCRL AB 3-0 PS2 18 (SUTURE) ×2 IMPLANT
SUT MON AB 2-0 CT1 36 (SUTURE) ×2 IMPLANT
SUT STRATAFIX PDO 1 14 VIOLET (SUTURE) ×2
SUT STRATFX PDO 1 14 VIOLET (SUTURE) ×1
SUT VIC AB 2-0 CT1 27 (SUTURE) ×2
SUT VIC AB 2-0 CT1 TAPERPNT 27 (SUTURE) IMPLANT
SUTURE STRATFX PDO 1 14 VIOLET (SUTURE) ×1 IMPLANT
SYR 3ML LL SCALE MARK (SYRINGE) ×2 IMPLANT
TRAY FOLEY MTR SLVR 16FR STAT (SET/KITS/TRAYS/PACK) IMPLANT
TUBE SUCTION HIGH CAP CLEAR NV (SUCTIONS) ×2 IMPLANT
WATER STERILE IRR 1000ML POUR (IV SOLUTION) ×2 IMPLANT

## 2021-07-08 NOTE — Transfer of Care (Signed)
Immediate Anesthesia Transfer of Care Note  Patient: Jessica Anthony  Procedure(s) Performed: TOTAL HIP ARTHROPLASTY ANTERIOR APPROACH (Right: Hip)  Patient Location: PACU  Anesthesia Type:Spinal  Level of Consciousness: awake and patient cooperative  Airway & Oxygen Therapy: Patient Spontanous Breathing and Patient connected to face mask oxygen  Post-op Assessment: Report given to RN and Post -op Vital signs reviewed and stable  Post vital signs: Reviewed and stable  Last Vitals:  Vitals Value Taken Time  BP 112/63 07/08/21 1624  Temp    Pulse 90 07/08/21 1626  Resp 15 07/08/21 1626  SpO2 100 % 07/08/21 1626  Vitals shown include unvalidated device data.  Last Pain:  Vitals:   07/07/21 2358  TempSrc:   PainSc: 5          Complications: No notable events documented.

## 2021-07-08 NOTE — Anesthesia Postprocedure Evaluation (Signed)
Anesthesia Post Note  Patient: Jessica Anthony  Procedure(s) Performed: TOTAL HIP ARTHROPLASTY ANTERIOR APPROACH (Right: Hip)     Patient location during evaluation: PACU Anesthesia Type: MAC Level of consciousness: awake and alert Pain management: pain level controlled Vital Signs Assessment: post-procedure vital signs reviewed and stable Respiratory status: spontaneous breathing and respiratory function stable Cardiovascular status: blood pressure returned to baseline and stable Postop Assessment: spinal receding Anesthetic complications: no   No notable events documented.  Last Vitals:  Vitals:   07/08/21 1700 07/08/21 1715  BP: 119/68 109/61  Pulse: 83 80  Resp: 11 16  Temp:    SpO2: 96% 99%    Last Pain:  Vitals:   07/08/21 1715  TempSrc:   PainSc: 0-No pain                 Manoj Enriquez DANIEL

## 2021-07-08 NOTE — Plan of Care (Signed)
  Problem: Clinical Measurements: Goal: Respiratory complications will improve Outcome: Progressing   Problem: Clinical Measurements: Goal: Cardiovascular complication will be avoided Outcome: Progressing   Problem: Activity: Goal: Risk for activity intolerance will decrease Outcome: Progressing   Problem: Nutrition: Goal: Adequate nutrition will be maintained Outcome: Progressing   Problem: Pain Managment: Goal: General experience of comfort will improve Outcome: Progressing   Problem: Safety: Goal: Ability to remain free from injury will improve Outcome: Progressing

## 2021-07-08 NOTE — Anesthesia Preprocedure Evaluation (Addendum)
Anesthesia Evaluation  Patient identified by MRN, date of birth, ID band Patient awake    Reviewed: Allergy & Precautions, NPO status , Patient's Chart, lab work & pertinent test results  History of Anesthesia Complications Negative for: history of anesthetic complications  Airway Mallampati: II  TM Distance: >3 FB Neck ROM: Full    Dental no notable dental hx. (+) Dental Advisory Given   Pulmonary neg pulmonary ROS,    Pulmonary exam normal        Cardiovascular hypertension, Pt. on medications Normal cardiovascular exam     Neuro/Psych negative neurological ROS     GI/Hepatic negative GI ROS, Neg liver ROS,   Endo/Other  negative endocrine ROS  Renal/GU negative Renal ROS     Musculoskeletal negative musculoskeletal ROS (+)   Abdominal   Peds  Hematology negative hematology ROS (+)   Anesthesia Other Findings   Reproductive/Obstetrics                            Anesthesia Physical Anesthesia Plan  ASA: 2  Anesthesia Plan: MAC and Spinal   Post-op Pain Management: Celebrex PO (pre-op) and Tylenol PO (pre-op)   Induction:   PONV Risk Score and Plan: 3 and Ondansetron and Dexamethasone  Airway Management Planned: Natural Airway  Additional Equipment:   Intra-op Plan:   Post-operative Plan: Extubation in OR  Informed Consent: I have reviewed the patients History and Physical, chart, labs and discussed the procedure including the risks, benefits and alternatives for the proposed anesthesia with the patient or authorized representative who has indicated his/her understanding and acceptance.     Dental advisory given  Plan Discussed with: Anesthesiologist and CRNA  Anesthesia Plan Comments:        Anesthesia Quick Evaluation

## 2021-07-08 NOTE — Progress Notes (Addendum)
PROGRESS NOTE  Evaleigh Anthony WUG:[id number] DOB: 04/12/1946 DOA: 07/07/2021 PCP: Merri Brunette, MD   LOS: 1 day   Brief narrative: Patient is a 75 years old female with past medical history of hyperlipidemia and hypertension presented to hospital after sustaining a fall at home.  Patient subsequently started having right hip pain and was noted to have a right femoral neck fracture.  Orthopedics was consulted from the ED and patient was admitted hospital for further evaluation and treatment.  Assessment/Plan:  Principal Problem:   Femoral neck fracture (HCC) Active Problems:   Hyperlipidemia   Essential hypertension   Anxiety   Depression  Right femoral neck fracture  Plan for surgical intervention today.  We will follow orthopedic recommendations.  Depression Continue zoloft    Anxiety Continue as needed Ativan.   Essential hypertension continue losartan and norvasc. HCTZ has been on hold.   DVT prophylaxis:    Lovenox subcu on hold for surgery.  Code Status: Full code  Family Communication: None  Disposition.  Patient is from home.  Will likely need rehabilitation.  Status is: Inpatient  Remains inpatient appropriate because: Hip fracture for hip surgery.  Consultants: Orthopedics  Procedures: None yet  Anti-infectives:  None  Anti-infectives (From admission, onward)    None      Subjective: Today, patient was seen and examined at bedside.  Patient denies any nausea vomiting fever or chills.  Has left hip pain.  No chest pain  Objective: Vitals:   07/08/21 0700 07/08/21 1000  BP: (!) 151/84 128/78  Pulse: 86 82  Resp: 18 13  Temp:    SpO2: 96% 97%    Intake/Output Summary (Last 24 hours) at 07/08/2021 1131 Last data filed at 07/07/2021 2246 Gross per 24 hour  Intake 432.5 ml  Output --  Net 432.5 ml   Filed Weights   07/07/21 1101  Weight: 54.4 kg   Body mass index is 20.6 kg/m.   Physical Exam:  GENERAL: Patient is alert  awake and oriented. Not in obvious distress.  On nasal cannula oxygen.   obese HENT: No scleral pallor or icterus. Pupils equally reactive to light. Oral mucosa is moist NECK: is supple, no gross swelling noted. CHEST: Clear to auscultation. No crackles or wheezes.  Diminished breath sounds bilaterally. CVS: S1 and S2 heard, no murmur. Regular rate and rhythm.  ABDOMEN: Soft, non-tender, bowel sounds are present. EXTREMITIES: No edema.  Right hip externally rotated. CNS: Cranial nerves are intact. No focal motor deficits. SKIN: warm and dry without rashes.  Data Review: I have personally reviewed the following laboratory data and studies,  CBC: Recent Labs  Lab 07/07/21 1156 07/08/21 0509  WBC 13.3* 7.8  NEUTROABS 11.5*  --   HGB 14.5 13.4  HCT 43.4 41.0  MCV 85.1 88.4  PLT 290 221   Basic Metabolic Panel: Recent Labs  Lab 07/07/21 1156 07/08/21 0509  NA 134* 135  K 3.5 3.2*  CL 101 106  CO2 23 25  GLUCOSE 128* 122*  BUN 15 11  CREATININE 0.65 0.60  CALCIUM 9.0 8.3*   Liver Function Tests: No results for input(s): AST, ALT, ALKPHOS, BILITOT, PROT, ALBUMIN in the last 168 hours. No results for input(s): LIPASE, AMYLASE in the last 168 hours. No results for input(s): AMMONIA in the last 168 hours. Cardiac Enzymes: No results for input(s): CKTOTAL, CKMB, CKMBINDEX, TROPONINI in the last 168 hours. BNP (last 3 results) No results for input(s): BNP in the last 8760 hours.  ProBNP (  last 3 results) No results for input(s): PROBNP in the last 8760 hours.  CBG: No results for input(s): GLUCAP in the last 168 hours. Recent Results (from the past 240 hour(s))  Resp Panel by RT-PCR (Flu A&B, Covid) Nasopharyngeal Swab     Status: None   Collection Time: 07/07/21  1:32 PM   Specimen: Nasopharyngeal Swab; Nasopharyngeal(NP) swabs in vial transport medium  Result Value Ref Range Status   SARS Coronavirus 2 by RT PCR NEGATIVE NEGATIVE Final    Comment: (NOTE) SARS-CoV-2  target nucleic acids are NOT DETECTED.  The SARS-CoV-2 RNA is generally detectable in upper respiratory specimens during the acute phase of infection. The lowest concentration of SARS-CoV-2 viral copies this assay can detect is 138 copies/mL. A negative result does not preclude SARS-Cov-2 infection and should not be used as the sole basis for treatment or other patient management decisions. A negative result may occur with  improper specimen collection/handling, submission of specimen other than nasopharyngeal swab, presence of viral mutation(s) within the areas targeted by this assay, and inadequate number of viral copies(<138 copies/mL). A negative result must be combined with clinical observations, patient history, and epidemiological information. The expected result is Negative.  Fact Sheet for Patients:  BloggerCourse.com  Fact Sheet for Healthcare Providers:  SeriousBroker.it  This test is no t yet approved or cleared by the [place] FDA and  has been authorized for detection and/or diagnosis of SARS-CoV-2 by FDA under an Emergency Use Authorization (EUA). This EUA will remain  in effect (meaning this test can be used) for the duration of the COVID-19 declaration under Section 564(b)(1) of the Act, 21 U.S.C.section 360bbb-3(b)(1), unless the authorization is terminated  or revoked sooner.       Influenza A by PCR NEGATIVE NEGATIVE Final   Influenza B by PCR NEGATIVE NEGATIVE Final    Comment: (NOTE) The Xpert Xpress SARS-CoV-2/FLU/RSV plus assay is intended as an aid in the diagnosis of influenza from Nasopharyngeal swab specimens and should not be used as a sole basis for treatment. Nasal washings and aspirates are unacceptable for Xpert Xpress SARS-CoV-2/FLU/RSV testing.  Fact Sheet for Patients: BloggerCourse.com  Fact Sheet for Healthcare  Providers: SeriousBroker.it  This test is not yet approved or cleared by the [place] FDA and has been authorized for detection and/or diagnosis of SARS-CoV-2 by FDA under an Emergency Use Authorization (EUA). This EUA will remain in effect (meaning this test can be used) for the duration of the COVID-19 declaration under Section 564(b)(1) of the Act, 21 U.S.C. section 360bbb-3(b)(1), unless the authorization is terminated or revoked.  Performed at [facility], [address] [place], [place] [address]      Studies: DG Chest Port 1 View  Result Date: 07/07/2021 CLINICAL DATA:  Right hip fracture. EXAM: PORTABLE CHEST 1 VIEW COMPARISON:  None. FINDINGS: The heart size and mediastinal contours are within normal limits. Right lung is clear. Elevated left hemidiaphragm is noted with minimal left basilar subsegmental atelectasis or scarring. The visualized skeletal structures are unremarkable. IMPRESSION: Elevated left hemidiaphragm is noted with minimal left basilar subsegmental atelectasis or scarring. Electronically Signed   By: Lupita Raider M.D.   On: 07/07/2021 12:30   DG Knee Right Port  Result Date: 07/07/2021 CLINICAL DATA:  Fall last night, on ischial encounter. EXAM: PORTABLE RIGHT KNEE - 1-2 VIEW COMPARISON:  None. FINDINGS: No joint effusion or fracture.  No degenerative changes. IMPRESSION: Negative. Electronically Signed   By: Leanna Battles M.D.  On: 07/07/2021 14:15   DG Hip Unilat With Pelvis 2-3 Views Right  Result Date: 07/07/2021 CLINICAL DATA:  Right hip pain after fall. EXAM: DG HIP (WITH OR WITHOUT PELVIS) 2-3V RIGHT COMPARISON:  None. FINDINGS: Moderately displaced proximal right femoral neck fracture is noted. IMPRESSION: Moderately displaced proximal right femoral neck fracture. Electronically Signed   By: Lupita Raider M.D.   On: 07/07/2021 12:29      Joycelyn Das, MD  [facility] 07/08/2021   If 7PM-7AM, please contact night-coverage

## 2021-07-08 NOTE — Anesthesia Procedure Notes (Signed)
Spinal  Patient location during procedure: OR Start time: 07/08/2021 2:04 PM End time: 07/08/2021 2:14 PM Reason for block: surgical anesthesia Staffing Performed: anesthesiologist  Anesthesiologist: Heather Roberts, MD Preanesthetic Checklist Completed: patient identified, IV checked, risks and benefits discussed, surgical consent, monitors and equipment checked, pre-op evaluation and timeout performed Spinal Block Patient position: right lateral decubitus Prep: DuraPrep Patient monitoring: cardiac monitor, continuous pulse ox and blood pressure Approach: midline Location: L2-3 Injection technique: single-shot Needle Needle type: Pencan  Needle gauge: 24 G Needle length: 9 cm Assessment Events: CSF return Additional Notes Functioning IV was confirmed and monitors were applied. Sterile prep and drape, including hand hygiene and sterile gloves were used. The patient was positioned and the spine was prepped. The skin was anesthetized with lidocaine.  Free flow of clear CSF was obtained prior to injecting local anesthetic into the CSF.  The spinal needle aspirated freely following injection.  The needle was carefully withdrawn.  The patient tolerated the procedure well.

## 2021-07-08 NOTE — Anesthesia Procedure Notes (Signed)
Procedure Name: MAC Date/Time: 07/08/2021 2:25 PM Performed by: Eben Burow, CRNA Pre-anesthesia Checklist: Patient identified, Emergency Drugs available, Suction available, Patient being monitored and Timeout performed Oxygen Delivery Method: Simple face mask Placement Confirmation: positive ETCO2

## 2021-07-08 NOTE — Interval H&P Note (Signed)
History and Physical Interval Note:  07/08/2021 12:51 PM  Jessica Anthony  has presented today for surgery, with the diagnosis of RIGHT TOTAL HIP FRACTURE.  The various methods of treatment have been discussed with the patient and family. After consideration of risks, benefits and other options for treatment, the patient has consented to  Procedure(s): TOTAL HIP ARTHROPLASTY ANTERIOR APPROACH (Right) as a surgical intervention.  The patient's history has been reviewed, patient examined, no change in status, stable for surgery.  I have reviewed the patient's chart and labs.  Questions were answered to the patient's satisfaction.     Iline Oven Tammala Weider

## 2021-07-08 NOTE — Op Note (Signed)
OPERATIVE REPORT  SURGEON: Rod Can, MD   ASSISTANT: Cherlynn June, PA-C  PREOPERATIVE DIAGNOSIS: Displaced Right femoral neck fracture.   POSTOPERATIVE DIAGNOSIS: Displaced Right femoral neck fracture.   PROCEDURE: Right total hip arthroplasty, anterior approach.   IMPLANTS: Biomet Taperloc microplasty stem, size 13, standard offset. Biomet G7 OsseoTi Cup, size 50 mm. Biomet Vivacit-E liner, size 36 mm, D, neutral. Biomet metal head ball, size 36 -3 mm. 1.8 mm adult reconstruction cable.  ANESTHESIA:  MAC and Spinal  ANTIBIOTICS: 2g ancef.  ESTIMATED BLOOD LOSS:-150 mL    DRAINS: None.  COMPLICATIONS: None   CONDITION: PACU - hemodynamically stable.   BRIEF CLINICAL NOTE: Jessica Anthony is a 75 y.o. female with a displaced Right femoral neck fracture. The patient was admitted to the hospitalist service and underwent perioperative risk stratification and medical optimization. The risks, benefits, and alternatives to total hip arthroplasty were explained, and the patient elected to proceed.  PROCEDURE IN DETAIL: The patient was taken to the operating room and general anesthesia was induced on the hospital bed.  The patient was then positioned on the Hana table.  All bony prominences were well padded.  The hip was prepped and draped in the normal sterile surgical fashion.  A time-out was called verifying side and site of surgery. Antibiotics were given within 60 minutes of beginning the procedure.   Bikini incision was made, and the direct anterior approach to the hip was performed through the Hueter interval.  Lateral femoral circumflex vessels were treated with the Auqumantys. The anterior capsule was exposed and an inverted T capsulotomy was made.  Fracture hematoma was encountered and evacuated. The patient was found to have a comminuted vertical Right subcapital femoral neck fracture.  I freshened the femoral neck cut with a saw.  I removed the femoral neck fragment.  A  corkscrew was placed into the head and the head was removed.  This was passed to the back table and was measured. The pubofemoral ligament was released subperiosteally to the lesser trochanter. There was a vertical defect in the calcar that terminated at the level of the lesser trochanter. I elected to place a single prophylactic subperiosteal adult reconstruction cable.  Acetabular exposure was achieved, and the pulvinar and labrum were excised. Sequential reaming of the acetabulum was then performed up to a size 49 mm reamer under direct visulization. A 50 mm cup was then opened and impacted into place at approximately 40 degrees of abduction and 20 degrees of anteversion. The final polyethylene liner was impacted into place and acetabular osteophytes were removed.    I then gained femoral exposure taking care to protect the abductors and greater trochanter.  This was performed using standard external rotation, extension, and adduction.  A cookie cutter was used to enter the femoral canal, and then the femoral canal finder was placed.  Sequential broaching was performed up to a size 13.  Calcar planer was used on the femoral neck remnant.  I placed a standard offset neck and a trial head ball.  The hip was reduced.  Leg lengths and offset were checked fluoroscopically.  The hip was dislocated and trial components were removed.  The final implants were placed, and the hip was reduced.  Fluoroscopy was used to confirm component position and leg lengths.  At 90 degrees of external rotation and full extension, the hip was stable to an anterior directed force.   The wound was copiously irrigated with Irrisept solution and normal saline using pule lavage.  Marcaine solution was injected into the periarticular soft tissue.  The wound was closed in layers using #1 Stratafix for the fascia, 2-0 Vicryl for the subcutaneous fat, 2-0 Monocryl for the deep dermal layer, and staples + Dermabond for the skin.  Once the  glue was fully dried, an Aquacell Ag dressing was applied.  The patient was transported to the recovery room in stable condition.  Sponge, needle, and instrument counts were correct at the end of the case x2.  The patient tolerated the procedure well and there were no known complications.  Please note that a surgical assistant was a medical necessity for this procedure to perform it in a safe and expeditious manner. Assistant was necessary to provide appropriate retraction of vital neurovascular structures, to prevent femoral fracture, and to allow for anatomic placement of the prosthesis.

## 2021-07-08 NOTE — Discharge Instructions (Signed)
? ?Dr. Montie Gelardi ?Joint Replacement Specialist ?[facility] ?[address] ?[place], [place] [address] ?[phone] ? ? ?TOTAL HIP REPLACEMENT POSTOPERATIVE DIRECTIONS ? ? ? ?Hip Rehabilitation, Guidelines Following Surgery  ? ?WEIGHT BEARING ?Weight bearing as tolerated with assist device (walker, cane, etc) as directed, use it as long as suggested by your surgeon or therapist, typically at least 4-6 weeks. ? ?The results of a hip operation are greatly improved after range of motion and muscle strengthening exercises. Follow all safety measures which are given to protect your hip. If any of these exercises cause increased pain or swelling in your joint, decrease the amount until you are comfortable again. Then slowly increase the exercises. Call your caregiver if you have problems or questions.  ? ?HOME CARE INSTRUCTIONS  ?Most of the following instructions are designed to prevent the dislocation of your new hip.  ?Remove items at home which could result in a fall. This includes throw rugs or furniture in walking pathways.  ?Continue medications as instructed at time of discharge. ?You may have some home medications which will be placed on hold until you complete the course of blood thinner medication. ?You may start showering once you are discharged home. Do not remove your dressing. ?Do not put on socks or shoes without following the instructions of your caregivers.   ?Sit on chairs with arms. Use the chair arms to help push yourself up when arising.  ?Arrange for the use of a toilet seat elevator so you are not sitting low.  ?Walk with walker as instructed.  ?You may resume a sexual relationship in one month or when given the OK by your caregiver.  ?Use walker as long as suggested by your caregivers.  ?You may put full weight on your legs and walk as much as is comfortable. ?Avoid periods of inactivity such as sitting longer than an hour when not asleep. This helps prevent blood  clots.  ?You may return to work once you are cleared by your surgeon.  ?Do not drive a car for 6 weeks or until released by your surgeon.  ?Do not drive while taking narcotics.  ?Wear elastic stockings for two weeks following surgery during the day but you may remove then at night.  ?Make sure you keep all of your appointments after your operation with all of your doctors and caregivers. You should call the office at the above phone number and make an appointment for approximately two weeks after the date of your surgery. ?Please pick up a stool softener and laxative for home use as long as you are requiring pain medications. ?ICE to the affected hip every three hours for 30 minutes at a time and then as needed for pain and swelling. Continue to use ice on the hip for pain and swelling from surgery. You may notice swelling that will progress down to the foot and ankle.  This is normal after surgery.  Elevate the leg when you are not up walking on it.   ?It is important for you to complete the blood thinner medication as prescribed by your doctor. ?Continue to use the breathing machine which will help keep your temperature down.  It is common for your temperature to cycle up and down following surgery, especially at night when you are not up moving around and exerting yourself.  The breathing machine keeps your lungs expanded and your temperature down. ? ?RANGE OF MOTION AND STRENGTHENING EXERCISES  ?These exercises are designed to help you   keep full movement of your hip joint. Follow your caregiver's or physical therapist's instructions. Perform all exercises about fifteen times, three times per day or as directed. Exercise both hips, even if you have had only one joint replacement. These exercises can be done on a training (exercise) mat, on the floor, on a table or on a bed. Use whatever works the best and is most comfortable for you. Use music or television while you are exercising so that the exercises are a  pleasant break in your day. This will make your life better with the exercises acting as a break in routine you can look forward to.  ?Lying on your back, slowly slide your foot toward your buttocks, raising your knee up off the floor. Then slowly slide your foot back down until your leg is straight again.  ?Lying on your back spread your legs as far apart as you can without causing discomfort.  ?Lying on your side, raise your upper leg and foot straight up from the floor as far as is comfortable. Slowly lower the leg and repeat.  ?Lying on your back, tighten up the muscle in the front of your thigh (quadriceps muscles). You can do this by keeping your leg straight and trying to raise your heel off the floor. This helps strengthen the largest muscle supporting your knee.  ?Lying on your back, tighten up the muscles of your buttocks both with the legs straight and with the knee bent at a comfortable angle while keeping your heel on the floor.  ? ?SKILLED REHAB INSTRUCTIONS: ?If the patient is transferred to a skilled rehab facility following release from the hospital, a list of the current medications will be sent to the facility for the patient to continue.  When discharged from the skilled rehab facility, please have the facility set up the patient's Home Health Physical Therapy prior to being released. Also, the skilled facility will be responsible for providing the patient with their medications at time of release from the facility to include their pain medication and their blood thinner medication. If the patient is still at the rehab facility at time of the two week follow up appointment, the skilled rehab facility will also need to assist the patient in arranging follow up appointment in our office and any transportation needs. ? ?POST-OPERATIVE OPIOID TAPER INSTRUCTIONS: ?It is important to wean off of your opioid medication as soon as possible. If you do not need pain medication after your surgery it is ok  to stop day one. ?Opioids include: ?Codeine, Hydrocodone(Norco, Vicodin), Oxycodone(Percocet, oxycontin) and hydromorphone amongst others.  ?Long term and even short term use of opiods can cause: ?Increased pain response ?Dependence ?Constipation ?Depression ?Respiratory depression ?And more.  ?Withdrawal symptoms can include ?Flu like symptoms ?Nausea, vomiting ?And more ?Techniques to manage these symptoms ?Hydrate well ?Eat regular healthy meals ?Stay active ?Use relaxation techniques(deep breathing, meditating, yoga) ?Do Not substitute Alcohol to help with tapering ?If you have been on opioids for less than two weeks and do not have pain than it is ok to stop all together.  ?Plan to wean off of opioids ?This plan should start within one week post op of your joint replacement. ?Maintain the same interval or time between taking each dose and first decrease the dose.  ?Cut the total daily intake of opioids by one tablet each day ?Next start to increase the time between doses. ?The last dose that should be eliminated is the evening dose.  ? ? ?MAKE   SURE YOU:  ?Understand these instructions.  ?Will watch your condition.  ?Will get help right away if you are not doing well or get worse. ? ?Pick up stool softner and laxative for home use following surgery while on pain medications. ?Do not remove your dressing. ?The dressing is waterproof--it is OK to take showers. ?Continue to use ice for pain and swelling after surgery. ?Do not use any lotions or creams on the incision until instructed by your surgeon. ?Total Hip Protocol. ? ?

## 2021-07-09 LAB — BASIC METABOLIC PANEL
Anion gap: 5 (ref 5–15)
BUN: 12 mg/dL (ref 8–23)
CO2: 25 mmol/L (ref 22–32)
Calcium: 8.2 mg/dL — ABNORMAL LOW (ref 8.9–10.3)
Chloride: 103 mmol/L (ref 98–111)
Creatinine, Ser: 0.68 mg/dL (ref 0.44–1.00)
GFR, Estimated: >60 mL/min (ref >60)
Glucose, Bld: 132 mg/dL — ABNORMAL HIGH (ref 70–99)
Potassium: 3.4 mmol/L — ABNORMAL LOW (ref 3.5–5.1)
Sodium: 133 mmol/L — ABNORMAL LOW (ref 135–145)

## 2021-07-09 LAB — CBC
HCT: 33.9 % — ABNORMAL LOW (ref 36.0–46.0)
Hemoglobin: 11 g/dL — ABNORMAL LOW (ref 12.0–15.0)
MCH: 29 pg (ref 26.0–34.0)
MCHC: 32.4 g/dL (ref 30.0–36.0)
MCV: 89.4 fL (ref 80.0–100.0)
Platelets: 184 10*3/uL (ref 150–400)
RBC: 3.79 MIL/uL — ABNORMAL LOW (ref 3.87–5.11)
RDW: 13.1 % (ref 11.5–15.5)
WBC: 10.5 10*3/uL (ref 4.0–10.5)
nRBC: 0 % (ref 0.0–0.2)

## 2021-07-09 MED ORDER — POTASSIUM CHLORIDE CRYS ER 20 MEQ PO TBCR
40.0000 meq | EXTENDED_RELEASE_TABLET | Freq: Once | ORAL | Status: AC
  Administered 2021-07-09: 40 meq via ORAL
  Filled 2021-07-09: qty 2

## 2021-07-09 MED ORDER — ADULT MULTIVITAMIN W/MINERALS CH
1.0000 | ORAL_TABLET | Freq: Every day | ORAL | Status: DC
  Administered 2021-07-09 – 2021-07-10 (×2): 1 via ORAL
  Filled 2021-07-09 (×2): qty 1

## 2021-07-09 MED ORDER — HYDROCODONE-ACETAMINOPHEN 5-325 MG PO TABS
1.0000 | ORAL_TABLET | Freq: Four times a day (QID) | ORAL | 0 refills | Status: DC | PRN
Start: 2021-07-09 — End: 2021-10-28

## 2021-07-09 MED ORDER — ASPIRIN 81 MG PO CHEW: 81.0000 mg | CHEWABLE_TABLET | Freq: Two times a day (BID) | ORAL | 0 refills | Status: DC

## 2021-07-09 MED ORDER — ASPIRIN 81 MG PO CHEW: 81.0000 mg | CHEWABLE_TABLET | Freq: Two times a day (BID) | ORAL | 0 refills | Status: AC

## 2021-07-09 MED ORDER — HYDROCODONE-ACETAMINOPHEN 5-325 MG PO TABS: 1.0000 | ORAL_TABLET | Freq: Four times a day (QID) | ORAL | 0 refills | Status: DC | PRN

## 2021-07-09 MED ORDER — ENSURE SURGERY PO LIQD
237.0000 mL | ORAL | Status: DC
Start: 2021-07-09 — End: 2021-07-10
  Administered 2021-07-09: 237 mL via ORAL

## 2021-07-09 NOTE — Plan of Care (Signed)
  Problem: Health Behavior/Discharge Planning: Goal: Ability to manage health-related needs will improve Outcome: Progressing   Problem: Coping: Goal: Level of anxiety will decrease Outcome: Progressing   Problem: Pain Managment: Goal: General experience of comfort will improve Outcome: Progressing   Problem: Safety: Goal: Ability to remain free from injury will improve Outcome: Progressing   

## 2021-07-09 NOTE — Plan of Care (Signed)
  Problem: Pain Managment: Goal: General experience of comfort will improve Outcome: Progressing   Problem: Safety: Goal: Ability to remain free from injury will improve Outcome: Progressing   Problem: Skin Integrity: Goal: Risk for impaired skin integrity will decrease Outcome: Progressing   Problem: Activity: Goal: Ability to avoid complications of mobility impairment will improve Outcome: Progressing   

## 2021-07-09 NOTE — Progress Notes (Addendum)
PROGRESS NOTE  Jessica Anthony IOX:[id number] DOB: July 10, 1946 DOA: 07/07/2021 PCP: Merri Brunette, MD   LOS: 2 days   Brief narrative: Patient is a 75 years old female with past medical history of hyperlipidemia and hypertension presented to hospital after sustaining a fall at home.  Patient subsequently started having right hip pain and was noted to have a right femoral neck fracture.  Orthopedics was consulted from the ED and patient was admitted hospital for further evaluation and treatment.  Assessment/Plan:  Principal Problem:   Femoral neck fracture (HCC) Active Problems:   Hyperlipidemia   Essential hypertension   Anxiety   Depression  Right femoral neck fracture status post right total hip arthroplasty on 07/08/2021.   We will follow orthopedic recommendation.  Aspirin has been initiated for DVT prophylaxis at full dose.  Continue symptomatic care and evaluation.  PT OT pending.  Mild hypokalemia.  Replenished orally.  Check levels in a.m.  Depression Continue zoloft    Anxiety Continue as needed Ativan.  Hyperlipidemia.  On pravastatin and fenofibrate.   Essential hypertension continue losartan and norvasc. HCTZ has been on hold.   DVT prophylaxis: SCDs Start: 07/08/21 1759   Lovenox subcu  Code Status: Full code  Family Communication:  Spoke with the patient's son and updated him about the clinical condition of the patient.  Disposition.   Patient is from home.  Will likely need rehabilitation, pending PT, OT evaluation.  Status is: Inpatient  Remains inpatient appropriate because: Status post hip surgery, need for rehabilitation likely.  Consultants: Orthopedics  Procedures: right total hip arthroplasty on 07/08/2021.  Anti-infectives:  Preop antibiotic  Anti-infectives (From admission, onward)    Start     Dose/Rate Route Frequency Ordered Stop   07/08/21 2000  ceFAZolin (ANCEF) IVPB 2g/100 mL premix        2 g 200 mL/hr over 30 Minutes  Intravenous Every 6 hours 07/08/21 1758 07/09/21 0108   07/08/21 1215  ceFAZolin (ANCEF) IVPB 2g/100 mL premix        2 g 200 mL/hr over 30 Minutes Intravenous On call to O.R. 07/08/21 1200 07/08/21 1419   07/08/21 1209  ceFAZolin (ANCEF) 2-4 GM/100ML-% IVPB       Note to Pharmacy: Marney Doctor J: cabinet override      07/08/21 1209 07/08/21 1420      Subjective: Today, patient was seen and examined at bedside.  Patient feels well.  Denies overt pain.  Has ambulated with physical therapy.  Objective: Vitals:   07/09/21 0136 07/09/21 0521  BP: 129/75 125/66  Pulse: 85 85  Resp: 16 16  Temp: (!) 96.6 F (35.9 C) 98 F (36.7 C)  SpO2: 100% 100%    Intake/Output Summary (Last 24 hours) at 07/09/2021 0753 Last data filed at 07/09/2021 0503 Gross per 24 hour  Intake 3442.33 ml  Output 1350 ml  Net 2092.33 ml    Filed Weights   07/07/21 1101 07/08/21 1256  Weight: 54.4 kg 54 kg   Body mass index is 20.43 kg/m.   Physical Exam:  GENERAL: Patient is alert awake and oriented.  Obese, not in distress.   HENT: No scleral pallor or icterus. Pupils equally reactive to light. Oral mucosa is moist NECK: is supple, no gross swelling noted. CHEST: Clear to auscultation. No crackles or wheezes.  Diminished breath sounds bilaterally. CVS: S1 and S2 heard, no murmur. Regular rate and rhythm.  ABDOMEN: Soft, non-tender, bowel sounds are present. EXTREMITIES: Right hip with surgical incision.  Covered  with dressing. CNS: Cranial nerves are intact. No focal motor deficits. SKIN: warm and dry without rashes.  Data Review: I have personally reviewed the following laboratory data and studies,  CBC: Recent Labs  Lab 07/07/21 1156 07/08/21 0509  WBC 13.3* 7.8  NEUTROABS 11.5*  --   HGB 14.5 13.4  HCT 43.4 41.0  MCV 85.1 88.4  PLT 290 221    Basic Metabolic Panel: Recent Labs  Lab 07/07/21 1156 07/08/21 0509  NA 134* 135  K 3.5 3.2*  CL 101 106  CO2 23 25  GLUCOSE 128*  122*  BUN 15 11  CREATININE 0.65 0.60  CALCIUM 9.0 8.3*    Liver Function Tests: No results for input(s): AST, ALT, ALKPHOS, BILITOT, PROT, ALBUMIN in the last 168 hours. No results for input(s): LIPASE, AMYLASE in the last 168 hours. No results for input(s): AMMONIA in the last 168 hours. Cardiac Enzymes: No results for input(s): CKTOTAL, CKMB, CKMBINDEX, TROPONINI in the last 168 hours. BNP (last 3 results) No results for input(s): BNP in the last 8760 hours.  ProBNP (last 3 results) No results for input(s): PROBNP in the last 8760 hours.  CBG: No results for input(s): GLUCAP in the last 168 hours. Recent Results (from the past 240 hour(s))  Resp Panel by RT-PCR (Flu A&B, Covid) Nasopharyngeal Swab     Status: None   Collection Time: 07/07/21  1:32 PM   Specimen: Nasopharyngeal Swab; Nasopharyngeal(NP) swabs in vial transport medium  Result Value Ref Range Status   SARS Coronavirus 2 by RT PCR NEGATIVE NEGATIVE Final    Comment: (NOTE) SARS-CoV-2 target nucleic acids are NOT DETECTED.  The SARS-CoV-2 RNA is generally detectable in upper respiratory specimens during the acute phase of infection. The lowest concentration of SARS-CoV-2 viral copies this assay can detect is 138 copies/mL. A negative result does not preclude SARS-Cov-2 infection and should not be used as the sole basis for treatment or other patient management decisions. A negative result may occur with  improper specimen collection/handling, submission of specimen other than nasopharyngeal swab, presence of viral mutation(s) within the areas targeted by this assay, and inadequate number of viral copies(<138 copies/mL). A negative result must be combined with clinical observations, patient history, and epidemiological information. The expected result is Negative.  Fact Sheet for Patients:  BloggerCourse.com  Fact Sheet for Healthcare Providers:   SeriousBroker.it  This test is no t yet approved or cleared by the [place] FDA and  has been authorized for detection and/or diagnosis of SARS-CoV-2 by FDA under an Emergency Use Authorization (EUA). This EUA will remain  in effect (meaning this test can be used) for the duration of the COVID-19 declaration under Section 564(b)(1) of the Act, 21 U.S.C.section 360bbb-3(b)(1), unless the authorization is terminated  or revoked sooner.       Influenza A by PCR NEGATIVE NEGATIVE Final   Influenza B by PCR NEGATIVE NEGATIVE Final    Comment: (NOTE) The Xpert Xpress SARS-CoV-2/FLU/RSV plus assay is intended as an aid in the diagnosis of influenza from Nasopharyngeal swab specimens and should not be used as a sole basis for treatment. Nasal washings and aspirates are unacceptable for Xpert Xpress SARS-CoV-2/FLU/RSV testing.  Fact Sheet for Patients: BloggerCourse.com  Fact Sheet for Healthcare Providers: SeriousBroker.it  This test is not yet approved or cleared by the [place] FDA and has been authorized for detection and/or diagnosis of SARS-CoV-2 by FDA under an Emergency Use Authorization (EUA). This EUA will remain in effect (meaning  this test can be used) for the duration of the COVID-19 declaration under Section 564(b)(1) of the Act, 21 U.S.C. section 360bbb-3(b)(1), unless the authorization is terminated or revoked.  Performed at [facility], [address], [place], [place] [address]       Studies: Pelvis Portable  Result Date: 07/08/2021 CLINICAL DATA:  Status post right hip replacement EXAM: PORTABLE PELVIS 1 VIEWS COMPARISON:  Film from earlier in the same day. FINDINGS: Right hip placement is noted in satisfactory position. Proximal cerclage wire is seen. No acute fracture is noted IMPRESSION: Status post right hip replacement Electronically Signed   By: Alcide Clever M.D.   On: 07/08/2021 17:06   DG Chest Port 1 View  Result Date: 07/07/2021 CLINICAL DATA:  Right hip fracture. EXAM: PORTABLE CHEST 1 VIEW COMPARISON:  None. FINDINGS: The heart size and mediastinal contours are within normal limits. Right lung is clear. Elevated left hemidiaphragm is noted with minimal left basilar subsegmental atelectasis or scarring. The visualized skeletal structures are unremarkable. IMPRESSION: Elevated left hemidiaphragm is noted with minimal left basilar subsegmental atelectasis or scarring. Electronically Signed   By: Lupita Raider M.D.   On: 07/07/2021 12:30   DG Knee Right Port  Result Date: 07/07/2021 CLINICAL DATA:  Fall last night, on ischial encounter. EXAM: PORTABLE RIGHT KNEE - 1-2 VIEW COMPARISON:  None. FINDINGS: No joint effusion or fracture.  No degenerative changes. IMPRESSION: Negative. Electronically Signed   By: Leanna Battles M.D.   On: 07/07/2021 14:15   DG C-Arm 1-60 Min-No Report  Result Date: 07/08/2021 Fluoroscopy was utilized by the requesting physician.  No radiographic interpretation.   DG HIP UNILAT WITH PELVIS 1V RIGHT  Result Date: 07/08/2021 CLINICAL DATA:  Is operative imaging from right hip arthroplasty. EXAM: DG HIP (WITH OR WITHOUT PELVIS) 1V RIGHT; DG C-ARM 1-60 MIN-NO REPORT COMPARISON:  None. FINDINGS: Two submitted portable operative images show placement a total right hip arthroplasty. Prosthetic components appear well seated and well aligned. No convincing acute fracture or evidence of an operative complication. IMPRESSION: Well-positioned total right hip arthroplasty. Electronically Signed   By: Amie Portland M.D.   On: 07/08/2021 16:56   DG Hip Unilat With Pelvis 2-3 Views Right  Result Date: 07/07/2021 CLINICAL DATA:  Right hip pain after fall. EXAM: DG HIP (WITH OR WITHOUT PELVIS) 2-3V RIGHT COMPARISON:  None. FINDINGS: Moderately displaced proximal right femoral neck fracture is noted. IMPRESSION: Moderately  displaced proximal right femoral neck fracture. Electronically Signed   By: Lupita Raider M.D.   On: 07/07/2021 12:29      Joycelyn Das, MD  [facility] 07/09/2021  If 7PM-7AM, please contact night-coverage

## 2021-07-09 NOTE — Progress Notes (Signed)
Initial Nutrition Assessment  DOCUMENTATION CODES:   Not applicable  INTERVENTION:  - will order Ensure Surgery once/day, each supplement provides 330 kcal and 18 grams of protein. - will order 1 tablet multivitamin with minerals/day.    NUTRITION DIAGNOSIS:   Increased nutrient needs related to hip fracture, post-op healing as evidenced by estimated needs.  GOAL:   Patient will meet greater than or equal to 90% of their needs  MONITOR:   PO intake, Supplement acceptance, Labs, Weight trends  REASON FOR ASSESSMENT:   Consult Hip fracture protocol  ASSESSMENT:   75 year old female with medical hx of HTN and HLD. She presented to the ED after a fall at home with subsequent severe R hip pain. In the ED, she was found to have a R femoral neck fracture.  Patient is POD #1 R total hip arthroplasty.   She ate 100% of dinner last night and breakfast had been ordered.  MST score of 0. Patient denies any difficulty with eating or any appetite/intake changes PTA. She denies any recent weight changes.  Limited weight recordings in the chart show weight yesterday of 119 lb and the only other weight recorded in the chart was 118 lb on 12/19/14.  Ortho note this AM states patient is cleared for d/c once she is cleared by therapy.    Labs reviewed; Na: 133 mmol/l, K: 3.4 mmol/l, Ca: 8.2 mg/dl.  Medications reviewed; 250 mg ascorbic acid/day, 400 units cholecalciferol/day, 100 mg colace BID, 0.5 mg folvite x3 days/week, 40 mEq Klor-Con x1 dose 12/2, 1 tablet senokot BID.  IVF; NS @ 75 ml/hr.     NUTRITION - FOCUSED PHYSICAL EXAM:  No muscle or fat depletions.  Diet Order:   Diet Order             Diet Heart Room service appropriate? Yes; Fluid consistency: Thin  Diet effective now                   EDUCATION NEEDS:   No education needs have been identified at this time  Skin:  Skin Assessment: Skin Integrity Issues: Skin Integrity Issues:: Incisions Incisions: R  hip (12/1)  Last BM:  11/30 per nursing flow sheet documentation  Height:   Ht Readings from Last 1 Encounters:  07/08/21 5\' 4"  (1.626 m)    Weight:   Wt Readings from Last 1 Encounters:  07/08/21 54 kg    Estimated Nutritional Needs:  Kcal:  1650-1850 kcal Protein:  80-95 grams Fluid:  >/= 1.7 L/day      14/01/22, MS, RD, LDN, CNSC Inpatient Clinical Dietitian RD pager # available in [facility]  After hours/weekend pager # available in [facility]

## 2021-07-09 NOTE — Progress Notes (Signed)
Physical Therapy Treatment Patient Details Name: Jessica Anthony MRN: [id number] DOB: 02-18-1946 Today's Date: 07/09/2021   History of Present Illness Pt s/p fall with R hip fx and now s/p R THR by anterior direct approach.    PT Comments    Pt continues very cooperative but OOB deferred at pt request having just been up with OT.  Pt significant other present to review pt's HEP and with written instruction provided.  Multiple questions asked and answered.  Pt hopeful for dc home tomorrow.   Recommendations for follow up therapy are one component of a multi-disciplinary discharge planning process, led by the attending physician.  Recommendations may be updated based on patient status, additional functional criteria and insurance authorization.  Follow Up Recommendations  No PT follow up     Assistance Recommended at Discharge Frequent or constant Supervision/Assistance  Equipment Recommendations  Rolling walker (2 wheels)    Recommendations for Other Services       Precautions / Restrictions Precautions Precautions: Fall Restrictions Weight Bearing Restrictions: No Other Position/Activity Restrictions: WBAT     Mobility  Bed Mobility Overal bed mobility: Needs Assistance Bed Mobility: Supine to Sit;Sit to Supine     Supine to sit: Min assist Sit to supine: Min assist   General bed mobility comments: OOB deferred at pt request - had been up recently with OT    Transfers Overall transfer level: Needs assistance Equipment used: Rolling walker (2 wheels) Transfers: Sit to/from Stand Sit to Stand: Min guard           General transfer comment: min guard for safety. cues for hand placement with rw. to/from recliner, BSC and EOB    Ambulation/Gait                   Stairs             Wheelchair Mobility    Modified Rankin (Stroke Patients Only)       Balance Overall balance assessment: Needs assistance Sitting-balance support: No upper  extremity supported;Feet supported Sitting balance-Leahy Scale: Good     Standing balance support: Bilateral upper extremity supported;Single extremity supported Standing balance-Leahy Scale: Poor Standing balance comment: fair static standing, poor dynamic standing                            Cognition Arousal/Alertness: Awake/alert Behavior During Therapy: WFL for tasks assessed/performed Overall Cognitive Status: Within Functional Limits for tasks assessed                                 General Comments: some STM deficits noted, baseline?        Exercises Total Joint Exercises Ankle Circles/Pumps: AROM;Both;15 reps;Supine Quad Sets: AROM;Both;10 reps;Supine Heel Slides: AAROM;Right;20 reps;Supine Hip ABduction/ADduction: AAROM;Right;15 reps;Supine    General Comments        Pertinent Vitals/Pain Pain Assessment: 0-10 Pain Score: 4  Faces Pain Scale: Hurts little more Pain Location: R hip Pain Descriptors / Indicators: Sore Pain Intervention(s): Limited activity within patient's tolerance;Monitored during session;Patient requesting pain meds-RN notified    Home Living Family/patient expects to be discharged to:: Private residence Living Arrangements: Alone Available Help at Discharge: Other (Comment) (BF - pt states she can go to her home or his) Type of Home: House Home Access: Stairs to enter Entrance Stairs-Rails: None Entrance Stairs-Number of Steps: 3   Home Layout: One level Home Equipment:  None Additional Comments: Pt reports she can go to her BFs home and would have no stairs to negotiated including level entry    Prior Function            PT Goals (current goals can now be found in the care plan section) Acute Rehab PT Goals Patient Stated Goal: Regain IND PT Goal Formulation: With patient Time For Goal Achievement: 07/16/21 Potential to Achieve Goals: Good Progress towards PT goals: Progressing toward goals     Frequency    7X/week      PT Plan Current plan remains appropriate    Co-evaluation              AM-PAC PT "6 Clicks" Mobility   Outcome Measure  Help needed turning from your back to your side while in a flat bed without using bedrails?: A Little Help needed moving from lying on your back to sitting on the side of a flat bed without using bedrails?: A Little Help needed moving to and from a bed to a chair (including a wheelchair)?: A Little Help needed standing up from a chair using your arms (e.g., wheelchair or bedside chair)?: A Little Help needed to walk in hospital room?: A Little Help needed climbing 3-5 steps with a railing? : A Lot 6 Click Score: 17    End of Session Equipment Utilized During Treatment: Gait belt Activity Tolerance: Patient tolerated treatment well Patient left: with call bell/phone within reach;in bed;with bed alarm set;with family/visitor present Nurse Communication: Mobility status PT Visit Diagnosis: Difficulty in walking, not elsewhere classified (R26.2)     Time: 1601-0932 PT Time Calculation (min) (ACUTE ONLY): 31 min  Charges:  $Therapeutic Exercise: 8-22 mins $Therapeutic Activity: 8-22 mins                     Mauro Kaufmann PT Acute Rehabilitation Services Pager [phone] Office [phone]    [name] 07/09/2021, 3:08 PM

## 2021-07-09 NOTE — Progress Notes (Signed)
    Subjective:  Patient reports pain as mild.  Denies N/V/CP/SOB.   Objective:   VITALS:   Vitals:   07/08/21 1745 07/08/21 2140 07/09/21 0136 07/09/21 0521  BP: 129/75 (!) 123/54 129/75 125/66  Pulse: 83 86 85 85  Resp: 13 16 16 16   Temp:  98.1 F (36.7 C) (!) 96.6 F (35.9 C) 98 F (36.7 C)  TempSrc:    Axillary  SpO2: 99% 97% 100% 100%  Weight:      Height:        NAD ABD soft Neurovascular intact Sensation intact distally Intact pulses distally Dorsiflexion/Plantar flexion intact Incision: dressing C/D/I   Lab Results  Component Value Date   WBC 7.8 07/08/2021   HGB 13.4 07/08/2021   HCT 41.0 07/08/2021   MCV 88.4 07/08/2021   PLT 221 07/08/2021   BMET    Component Value Date/Time   NA 135 07/08/2021 0509   K 3.2 (L) 07/08/2021 0509   CL 106 07/08/2021 0509   CO2 25 07/08/2021 0509   GLUCOSE 122 (H) 07/08/2021 0509   BUN 11 07/08/2021 0509   CREATININE 0.60 07/08/2021 0509   CALCIUM 8.3 (L) 07/08/2021 0509   GFRNONAA >60 07/08/2021 0509     Assessment/Plan: 1 Day Post-Op   Principal Problem:   Femoral neck fracture (HCC) Active Problems:   Hyperlipidemia   Essential hypertension   Anxiety   Depression   WBAT with walker DVT ppx: Aspirin, SCDs, TEDS PO pain control PT/OT Dispo: D/C pending. Needs follow up in 2 weeks for incision check and radiographs. Clear from orthopedic standpoint once cleared by therapy     14/08/2020 07/09/2021, 8:10 AM  [facility] is now [facility] [address], [address], [place], [place] Phone: [phone] [email or web address] Facebook  [phone]

## 2021-07-09 NOTE — Evaluation (Signed)
Occupational Therapy Evaluation Patient Details Name: Jessica Anthony MRN: [id number] DOB: July 21, 1946 Today's Date: 07/09/2021   History of Present Illness Pt s/p fall with R hip fx and now s/p R THR by anterior direct approach.   Clinical Impression   Pt admitted with the above diagnoses and presents with below problem list. Pt will benefit from continued acute OT to address the below listed deficits and maximize independence with basic ADLs prior to d/c home. At baseline, pt is independent with ADLs. Pt currently needs up to min A with LB ADLs and bed mobility, min guard with toilet transfers and functional mobility. Began education on LB ADL techniques. Significant other present throughout session.        Recommendations for follow up therapy are one component of a multi-disciplinary discharge planning process, led by the attending physician.  Recommendations may be updated based on patient status, additional functional criteria and insurance authorization.   Follow Up Recommendations  Home health OT    Assistance Recommended at Discharge Frequent or constant Supervision/Assistance  Functional Status Assessment  Patient has had a recent decline in their functional status and demonstrates the ability to make significant improvements in function in a reasonable and predictable amount of time.  Equipment Recommendations  BSC/3in1    Recommendations for Other Services       Precautions / Restrictions Precautions Precautions: Fall Restrictions Weight Bearing Restrictions: No Other Position/Activity Restrictions: WBAT      Mobility Bed Mobility Overal bed mobility: Needs Assistance Bed Mobility: Supine to Sit;Sit to Supine     Supine to sit: Min assist Sit to supine: Min assist   General bed mobility comments: Extra time and effort. assist to pivot hips to full EOB position. assist to fully advance RLE onto bed    Transfers Overall transfer level: Needs  assistance Equipment used: Rolling walker (2 wheels) Transfers: Sit to/from Stand Sit to Stand: Min guard           General transfer comment: min guard for safety. cues for hand placement with rw. to/from recliner, BSC and EOB      Balance Overall balance assessment: Needs assistance Sitting-balance support: No upper extremity supported;Feet supported Sitting balance-Leahy Scale: Good     Standing balance support: Bilateral upper extremity supported;Single extremity supported Standing balance-Leahy Scale: Poor Standing balance comment: fair static standing, poor dynamic standing                           ADL either performed or assessed with clinical judgement   ADL Overall ADL's : Needs assistance/impaired Eating/Feeding: Set up   Grooming: Set up;Min guard;Sitting;Standing   Upper Body Bathing: Set up;Sitting   Lower Body Bathing: Minimal assistance;Sit to/from stand   Upper Body Dressing : Set up;Sitting   Lower Body Dressing: Minimal assistance;Sit to/from stand   Toilet Transfer: Min guard;Ambulation;BSC/3in1;Rolling walker (2 wheels) Toilet Transfer Details (indicate cue type and reason): walked to bathroom to access BSC/3n1 Toileting- Clothing Manipulation and Hygiene: Minimal assistance;Sit to/from stand       Functional mobility during ADLs: Min guard;Rolling walker (2 wheels) General ADL Comments: Pt walked to bathroom to complete toilet transfer and pericare. Cues for technique and min A with LB dressing (donned mesh panty + pad). Pt has a reacher at home, discussed AE technique     Vision Baseline Vision/History: 1 Wears glasses       Perception     Praxis      Pertinent Vitals/Pain  Pain Assessment: Faces Pain Score: 2  Faces Pain Scale: Hurts little more Pain Location: R hip Pain Descriptors / Indicators: Sore Pain Intervention(s): Monitored during session;Limited activity within patient's tolerance;Repositioned     Hand  Dominance     Extremity/Trunk Assessment Upper Extremity Assessment Upper Extremity Assessment: Generalized weakness;Overall [facility] for tasks assessed   Lower Extremity Assessment Lower Extremity Assessment: Defer to PT evaluation RLE Deficits / Details: AAROM at hip to 85 flex and 20 abd; strength at hip 2+/5   Cervical / Trunk Assessment Cervical / Trunk Assessment: Normal   Communication Communication Communication: No difficulties   Cognition Arousal/Alertness: Awake/alert Behavior During Therapy: WFL for tasks assessed/performed Overall Cognitive Status: Within Functional Limits for tasks assessed                                 General Comments: some STM deficits noted, baseline?     General Comments       Exercises Exercises: Total Joint Total Joint Exercises Ankle Circles/Pumps: AROM;Both;15 reps;Supine Quad Sets: AROM;Both;10 reps;Supine Heel Slides: AAROM;Right;20 reps;Supine Hip ABduction/ADduction: AAROM;Right;15 reps;Supine   Shoulder Instructions      Home Living Family/patient expects to be discharged to:: Private residence Living Arrangements: Alone Available Help at Discharge: Other (Comment) (BF - pt states she can go to her home or his) Type of Home: House Home Access: Stairs to enter [employer] of Steps: 3 Entrance Stairs-Rails: None Home Layout: One level     Bathroom Shower/Tub: Tub/shower unit         Home Equipment: None   Additional Comments: Pt reports she can go to her BFs home and would have no stairs to negotiated including level entry      Prior Functioning/Environment Prior Level of Function : Independent/Modified Independent                        OT Problem List: Decreased strength;Impaired balance (sitting and/or standing);Decreased activity tolerance;Decreased knowledge of use of DME or AE;Decreased knowledge of precautions;Pain      OT Treatment/Interventions: Self-care/ADL training;DME  and/or AE instruction;Balance training;Patient/family education;Therapeutic activities    OT Goals(Current goals can be found in the care plan section) Acute Rehab OT Goals Patient Stated Goal: home with assist from SO OT Goal Formulation: With patient Time For Goal Achievement: 07/23/21 Potential to Achieve Goals: Good ADL Goals Pt Will Perform Lower Body Bathing: with modified independence;sit to/from stand Pt Will Perform Lower Body Dressing: with modified independence;sit to/from stand Pt Will Transfer to Toilet: with modified independence;ambulating Pt Will Perform Toileting - Clothing Manipulation and hygiene: with modified independence;sit to/from stand Additional ADL Goal #1: Pt will complete bed mobility at supervision level to prepare for EOB/OOB ADLs.  OT Frequency: Min 2X/week   Barriers to D/C:            Co-evaluation              AM-PAC OT "6 Clicks" Daily Activity     Outcome Measure Help from another person eating meals?: None Help from another person taking care of personal grooming?: None Help from another person toileting, which includes using toliet, bedpan, or urinal?: A Little Help from another person bathing (including washing, rinsing, drying)?: A Little Help from another person to put on and taking off regular upper body clothing?: None Help from another person to put on and taking off regular lower body clothing?: A Little 6  Click Score: 21   End of Session Equipment Utilized During Treatment: Rolling walker (2 wheels)  Activity Tolerance: Patient tolerated treatment well Patient left: in bed;with call bell/phone within reach;with bed alarm set;with family/visitor present  OT Visit Diagnosis: Unsteadiness on feet (R26.81);Pain                Time: 5726-2035 OT Time Calculation (min): 25 min Charges:  OT General Charges $OT Visit: 1 Visit OT Evaluation $OT Eval Moderate Complexity: 1 Mod OT Treatments $Self Care/Home Management : 8-22  mins  Raynald Kemp, OT Acute Rehabilitation Services Pager: [phone] Office: [phone]   Pilar Grammes 07/09/2021, 2:40 PM

## 2021-07-09 NOTE — Evaluation (Signed)
Physical Therapy Evaluation Patient Details Name: Jessica Anthony MRN: [id number] DOB: 01/02/1946 Today's Date: 07/09/2021  History of Present Illness  Pt s/p fall with R hip fx and now s/p R THR by anterior direct approach.  Clinical Impression  Pt s/p R THR and presents with decreased R LE strength/ROM and post op pain limiting functional mobility.  Pt should progress to dc home with assist of family/friends.     Recommendations for follow up therapy are one component of a multi-disciplinary discharge planning process, led by the attending physician.  Recommendations may be updated based on patient status, additional functional criteria and insurance authorization.  Follow Up Recommendations No PT follow up (HEP)    Assistance Recommended at Discharge Frequent or constant Supervision/Assistance  Functional Status Assessment Patient has had a recent decline in their functional status and demonstrates the ability to make significant improvements in function in a reasonable and predictable amount of time.  Equipment Recommendations  Rolling walker (2 wheels)    Recommendations for Other Services       Precautions / Restrictions Precautions Precautions: Fall Restrictions Weight Bearing Restrictions: No Other Position/Activity Restrictions: WBAT      Mobility  Bed Mobility Overal bed mobility: Needs Assistance Bed Mobility: Supine to Sit     Supine to sit: Min assist     General bed mobility comments: Increased time with cues for sequence and use of L LE to self assist    Transfers Overall transfer level: Needs assistance Equipment used: Rolling walker (2 wheels) Transfers: Sit to/from Stand Sit to Stand: Min guard           General transfer comment: Steady assist only with cues for LE management and use of UEs to self assist    Ambulation/Gait Ambulation/Gait assistance: Min assist;Min guard Gait Distance (Feet): 125 Feet Assistive device: Rolling walker (2  wheels) Gait Pattern/deviations: Step-to pattern;Step-through pattern;Decreased step length - right;Decreased step length - left;Shuffle;Trunk flexed Gait velocity: decreased but noted improvement with increased distance ambulated     General Gait Details: Increased time with cues for posture, position from RW and initial sequence  Stairs            Wheelchair Mobility    Modified Rankin (Stroke Patients Only)       Balance Overall balance assessment: Needs assistance Sitting-balance support: No upper extremity supported;Feet supported Sitting balance-Leahy Scale: Good     Standing balance support: No upper extremity supported Standing balance-Leahy Scale: Fair                               Pertinent Vitals/Pain Pain Assessment: 0-10 Pain Score: 2  Pain Location: R hip Pain Descriptors / Indicators: Sore Pain Intervention(s): Limited activity within patient's tolerance;Monitored during session;Premedicated before session    Home Living Family/patient expects to be discharged to:: Private residence Living Arrangements: Alone Available Help at Discharge: Other (Comment) (BF - pt states she can go to her home or his) Type of Home: House Home Access: Stairs to enter Entrance Stairs-Rails: None Entrance Stairs-Number of Steps: 3   Home Layout: One level Home Equipment: None Additional Comments: Pt reports she can go to her BFs home and would have no stairs to negotiated including level entry    Prior Function Prior Level of Function : Independent/Modified Independent                     Hand Dominance  Extremity/Trunk Assessment   Upper Extremity Assessment Upper Extremity Assessment: Overall WFL for tasks assessed    Lower Extremity Assessment Lower Extremity Assessment: RLE deficits/detail RLE Deficits / Details: AAROM at hip to 85 flex and 20 abd; strength at hip 2+/5    Cervical / Trunk Assessment Cervical / Trunk  Assessment: Normal  Communication   Communication: No difficulties  Cognition Arousal/Alertness: Awake/alert Behavior During Therapy: WFL for tasks assessed/performed Overall Cognitive Status: Within Functional Limits for tasks assessed                                          General Comments      Exercises Total Joint Exercises Ankle Circles/Pumps: AROM;Both;15 reps;Supine Quad Sets: AROM;Both;10 reps;Supine Heel Slides: AAROM;Right;20 reps;Supine Hip ABduction/ADduction: AAROM;Right;15 reps;Supine   Assessment/Plan    PT Assessment Patient needs continued PT services  PT Problem List Decreased strength;Decreased range of motion;Decreased activity tolerance;Decreased balance;Decreased mobility;Decreased knowledge of use of DME;Pain       PT Treatment Interventions DME instruction;Gait training;Stair training;Functional mobility training;Therapeutic activities;Therapeutic exercise;Patient/family education    PT Goals (Current goals can be found in the Care Plan section)  Acute Rehab PT Goals Patient Stated Goal: Regain IND PT Goal Formulation: With patient Time For Goal Achievement: 07/16/21 Potential to Achieve Goals: Good    Frequency 7X/week   Barriers to discharge        Co-evaluation               AM-PAC PT "6 Clicks" Mobility  Outcome Measure Help needed turning from your back to your side while in a flat bed without using bedrails?: A Little Help needed moving from lying on your back to sitting on the side of a flat bed without using bedrails?: A Little Help needed moving to and from a bed to a chair (including a wheelchair)?: A Little Help needed standing up from a chair using your arms (e.g., wheelchair or bedside chair)?: A Little Help needed to walk in hospital room?: A Little Help needed climbing 3-5 steps with a railing? : A Lot 6 Click Score: 17    End of Session Equipment Utilized During Treatment: Gait belt Activity  Tolerance: Patient tolerated treatment well Patient left: in chair;with call bell/phone within reach;with chair alarm set Nurse Communication: Mobility status PT Visit Diagnosis: Difficulty in walking, not elsewhere classified (R26.2)    Time: 6629-4765 PT Time Calculation (min) (ACUTE ONLY): 40 min   Charges:   PT Evaluation $PT Eval Low Complexity: 1 Low PT Treatments $Gait Training: 8-22 mins $Therapeutic Exercise: 8-22 mins        Mauro Kaufmann PT Acute Rehabilitation Services Pager [phone] Office [phone]   Kashish Yglesias 07/09/2021, 11:48 AM

## 2021-07-09 NOTE — Progress Notes (Signed)
    Subjective:  Patient reports pain as mild.  Denies N/V/CP/SOB.   Objective:   VITALS:   Vitals:   07/09/21 0521 07/09/21 1256 07/09/21 2029 07/10/21 0441  BP: 125/66 (!) 110/59 121/68 125/77  Pulse: 85 90 93 95  Resp: 16 18 16 16   Temp: 98 F (36.7 C)  98.4 F (36.9 C) 98.5 F (36.9 C)  TempSrc: Axillary  Oral Oral  SpO2: 100% 95% 91% 92%  Weight:      Height:        NAD ABD soft Neurovascular intact Sensation intact distally Intact pulses distally Dorsiflexion/Plantar flexion intact Incision: dressing C/D/I   Lab Results  Component Value Date   WBC 8.5 07/10/2021   HGB 10.4 (L) 07/10/2021   HCT 31.8 (L) 07/10/2021   MCV 88.8 07/10/2021   PLT 206 07/10/2021   BMET    Component Value Date/Time   NA 138 07/10/2021 0417   K 4.0 07/10/2021 0417   CL 106 07/10/2021 0417   CO2 27 07/10/2021 0417   GLUCOSE 110 (H) 07/10/2021 0417   BUN 18 07/10/2021 0417   CREATININE 0.67 07/10/2021 0417   CALCIUM 8.6 (L) 07/10/2021 0417   GFRNONAA >60 07/10/2021 0417     Assessment/Plan: 2 Days Post-Op Right THA  Principal Problem:   Femoral neck fracture (HCC) Active Problems:   Hyperlipidemia   Essential hypertension   Anxiety   Depression   WBAT with walker DVT ppx: Aspirin, SCDs, TEDS PO pain control PT/OT Dispo: D/C pending. Needs follow up in 2 weeks for incision check and radiographs. Clear from orthopedic standpoint once cleared by therapy   14/10/2020 07/10/2021, 8:56 AM

## 2021-07-10 LAB — CBC
HCT: 31.8 % — ABNORMAL LOW (ref 36.0–46.0)
Hemoglobin: 10.4 g/dL — ABNORMAL LOW (ref 12.0–15.0)
MCH: 29.1 pg (ref 26.0–34.0)
MCHC: 32.7 g/dL (ref 30.0–36.0)
MCV: 88.8 fL (ref 80.0–100.0)
Platelets: 206 10*3/uL (ref 150–400)
RBC: 3.58 MIL/uL — ABNORMAL LOW (ref 3.87–5.11)
RDW: 13.5 % (ref 11.5–15.5)
WBC: 8.5 10*3/uL (ref 4.0–10.5)
nRBC: 0 % (ref 0.0–0.2)

## 2021-07-10 LAB — BASIC METABOLIC PANEL
Anion gap: 5 (ref 5–15)
BUN: 18 mg/dL (ref 8–23)
CO2: 27 mmol/L (ref 22–32)
Calcium: 8.6 mg/dL — ABNORMAL LOW (ref 8.9–10.3)
Chloride: 106 mmol/L (ref 98–111)
Creatinine, Ser: 0.67 mg/dL (ref 0.44–1.00)
GFR, Estimated: >60 mL/min (ref >60)
Glucose, Bld: 110 mg/dL — ABNORMAL HIGH (ref 70–99)
Potassium: 4 mmol/L (ref 3.5–5.1)
Sodium: 138 mmol/L (ref 135–145)

## 2021-07-10 MED ORDER — POLYETHYLENE GLYCOL 3350 17 G PO PACK: 17.0000 g | PACK | Freq: Every day | ORAL | 0 refills | Status: AC | PRN

## 2021-07-10 MED ORDER — ONDANSETRON HCL 4 MG PO TABS: 4.0000 mg | ORAL_TABLET | Freq: Four times a day (QID) | ORAL | 0 refills | Status: AC | PRN

## 2021-07-10 NOTE — TOC Transition Note (Addendum)
Transition of Care [facility]) - CM/SW Discharge Note   Patient Details  Name: Jessica Anthony MRN: [id number] Date of Birth: 08/23/1945  [facility]) CM/SW Contact:  Darleene Cleaver, LCSW Phone Number: 07/10/2021, 11:54 AM   Clinical Narrative:     Patient is requesting home health PT and OT, CSW spoke to [facility], and they are able to accept patient for first visit on Monday or Tuesday.  Patient will be discharging back home.  Patient requested a rolling walker and bedside commode, CSW spoke to [employer], they will deliver equipment today before patient leaves.  Final next level of care: Home/Self Care Barriers to Discharge: Barriers Resolved   Patient Goals and CMS Choice Patient states their goals for this hospitalization and ongoing recovery are:: To return back home. CMS Medicare.gov Compare Post Acute Care list provided to:: Patient Choice offered to / list presented to : Patient  Discharge Placement                       Discharge Plan and Services                DME Arranged: Walker rolling DME Agency: [employer] Date DME Agency Contacted: 07/10/21 Time DME Agency Contacted: 1154 Representative spoke with at DME Agency: Leavy Cella            Social Determinants of Health (SDOH) Interventions     Readmission Risk Interventions No flowsheet data found.

## 2021-07-10 NOTE — Discharge Summary (Addendum)
Physician Discharge Summary  Jessica Anthony WUJ:[id number] DOB: 1945-12-09 DOA: 07/07/2021  PCP: Merri Brunette, MD  Admit date: 07/07/2021 Discharge date: 07/10/2021  Admitted From: Home  Discharge disposition: Home with home health  Recommendations for Outpatient Follow-Up:   Follow up with your primary care provider in one week.  Check CBC, BMP, magnesium in the next visit Follow-up with orthopedics in 2 weeks for x-rays and incision checkup.  Discharge Diagnosis:   Principal Problem:   Femoral neck fracture (HCC) Active Problems:   Hyperlipidemia   Essential hypertension   Anxiety   Depression   Discharge Condition: Improved.  Diet recommendation: Low sodium, heart healthy.    Wound care: None.  Code status: Full.   History of Present Illness:   Patient is a 75 years old female with past medical history of hyperlipidemia and hypertension presented to hospital after sustaining a fall at home.  Patient subsequently started having right hip pain and was noted to have a right femoral neck fracture.  Orthopedics was consulted from the ED and patient was admitted hospital for further evaluation and treatment.  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   Aspirin has been initiated for DVT prophylaxis, physical therapy occupational therapy has seen the patient and recommended no skilled therapy needs.  Recommended rolling walker.  Occupational Therapy has recommended home health OT on discharge. Patient will need to follow with orthopedics as outpatient in 2 weeks.   mild hypokalemia.  Replenished orally.  Potassium prior to discharge was 4.0.   Depression Continue zoloft    Anxiety Continue as needed Ativan.   Hyperlipidemia.  On pravastatin and fenofibrate.   Essential hypertension continue home regimen on  discharge.  Disposition.  At this time, patient is stable for disposition home with home Health.   Medical Consultants:   Orthopedics Procedures:    right total hip arthroplasty on 07/08/2021. Subjective:   Today, patient was seen and examined at bedside.  Patient feels okay.  Denies any nausea vomiting fever chills or rigor.  Denies overt pain.  Discharge Exam:   Vitals:   07/09/21 2029 07/10/21 0441  BP: 121/68 125/77  Pulse: 93 95  Resp: 16 16  Temp: 98.4 F (36.9 C) 98.5 F (36.9 C)  SpO2: 91% 92%   Vitals:   07/09/21 0521 07/09/21 1256 07/09/21 2029 07/10/21 0441  BP: 125/66 (!) 110/59 121/68 125/77  Pulse: 85 90 93 95  Resp: Temp: 98 F (36.7 C)  98.4 F (36.9 C) 98.5 F (36.9 C)  TempSrc: Axillary  Oral Oral  SpO2: 100% 95% 91% 92%  Weight:      Height:       General: Alert awake, not in obvious distress, obese HENT: pupils equally reacting to light,  No scleral pallor or icterus noted. Oral mucosa is moist.  Chest:  Clear breath sounds.  Diminished breath sounds bilaterally. No crackles or wheezes.  CVS: S1 &S2 heard. No murmur.  Regular rate and rhythm. Abdomen: Soft, nontender, nondistended.  Bowel sounds are heard.   Extremities: No cyanosis, clubbing or edema.  Peripheral pulses are palpable.  Right hip with surgical incision covered with dressing. Psych: Alert, awake and oriented, normal mood CNS:  No cranial nerve deficits.  Power equal in all extremities.   Skin: Warm and dry.  No rashes noted.  The results of significant diagnostics from  this hospitalization (including imaging, microbiology, ancillary and laboratory) are listed below for reference.     Diagnostic Studies:   Pelvis Portable  Result Date: 07/08/2021 CLINICAL DATA:  Status post right hip replacement EXAM: PORTABLE PELVIS 1 VIEWS COMPARISON:  Film from earlier in the same day. FINDINGS: Right hip placement is noted in satisfactory position. Proximal cerclage wire is  seen. No acute fracture is noted IMPRESSION: Status post right hip replacement Electronically Signed   By: Alcide Clever M.D.   On: 07/08/2021 17:06   DG Chest Port 1 View  Result Date: 07/07/2021 CLINICAL DATA:  Right hip fracture. EXAM: PORTABLE CHEST 1 VIEW COMPARISON:  None. FINDINGS: The heart size and mediastinal contours are within normal limits. Right lung is clear. Elevated left hemidiaphragm is noted with minimal left basilar subsegmental atelectasis or scarring. The visualized skeletal structures are unremarkable. IMPRESSION: Elevated left hemidiaphragm is noted with minimal left basilar subsegmental atelectasis or scarring. Electronically Signed   By: Lupita Raider M.D.   On: 07/07/2021 12:30   DG Knee Right Port  Result Date: 07/07/2021 CLINICAL DATA:  Fall last night, on ischial encounter. EXAM: PORTABLE RIGHT KNEE - 1-2 VIEW COMPARISON:  None. FINDINGS: No joint effusion or fracture.  No degenerative changes. IMPRESSION: Negative. Electronically Signed   By: Leanna Battles M.D.   On: 07/07/2021 14:15   DG C-Arm 1-60 Min-No Report  Result Date: 07/08/2021 Fluoroscopy was utilized by the requesting physician.  No radiographic interpretation.   DG HIP UNILAT WITH PELVIS 1V RIGHT  Result Date: 07/08/2021 CLINICAL DATA:  Is operative imaging from right hip arthroplasty. EXAM: DG HIP (WITH OR WITHOUT PELVIS) 1V RIGHT; DG C-ARM 1-60 MIN-NO REPORT COMPARISON:  None. FINDINGS: Two submitted portable operative images show placement a total right hip arthroplasty. Prosthetic components appear well seated and well aligned. No convincing acute fracture or evidence of an operative complication. IMPRESSION: Well-positioned total right hip arthroplasty. Electronically Signed   By: Amie Portland M.D.   On: 07/08/2021 16:56   DG Hip Unilat With Pelvis 2-3 Views Right  Result Date: 07/07/2021 CLINICAL DATA:  Right hip pain after fall. EXAM: DG HIP (WITH OR WITHOUT PELVIS) 2-3V RIGHT COMPARISON:   None. FINDINGS: Moderately displaced proximal right femoral neck fracture is noted. IMPRESSION: Moderately displaced proximal right femoral neck fracture. Electronically Signed   By: Lupita Raider M.D.   On: 07/07/2021 12:29     Labs:   Basic Metabolic Panel: Recent Labs  Lab 07/07/21 1156 07/08/21 0509 07/09/21 0843 07/10/21 0417  NA 134* 135 133* 138  K 3.5 3.2* 3.4* 4.0  CL 101 106 103 106  CO2 23 25 25 27   GLUCOSE 128* 122* 132* 110*  BUN 15 11 12 18   CREATININE 0.65 0.60 0.68 0.67  CALCIUM 9.0 8.3* 8.2* 8.6*   GFR Estimated Creatinine Clearance: 51 mL/min (by C-G formula based on SCr of 0.67 mg/dL). Liver Function Tests: No results for input(s): AST, ALT, ALKPHOS, BILITOT, PROT, ALBUMIN in the last 168 hours. No results for input(s): LIPASE, AMYLASE in the last 168 hours. No results for input(s): AMMONIA in the last 168 hours. Coagulation profile Recent Labs  Lab 07/07/21 1156  INR 0.9    CBC: Recent Labs  Lab 07/07/21 1156 07/08/21 0509 07/09/21 0843 07/10/21 0417  WBC 13.3* 7.8 10.5 8.5  NEUTROABS 11.5*  --   --   --   HGB 14.5 13.4 11.0* 10.4*  HCT 43.4 41.0 33.9* 31.8*  MCV 85.1 88.4  89.4 88.8  PLT 290 221 184 206   Cardiac Enzymes: No results for input(s): CKTOTAL, CKMB, CKMBINDEX, TROPONINI in the last 168 hours. BNP: Invalid input(s): POCBNP CBG: No results for input(s): GLUCAP in the last 168 hours. D-Dimer No results for input(s): DDIMER in the last 72 hours. Hgb A1c No results for input(s): HGBA1C in the last 72 hours. Lipid Profile No results for input(s): CHOL, HDL, LDLCALC, TRIG, CHOLHDL, LDLDIRECT in the last 72 hours. Thyroid function studies No results for input(s): TSH, T4TOTAL, T3FREE, THYROIDAB in the last 72 hours.  Invalid input(s): FREET3 Anemia work up No results for input(s): VITAMINB12, FOLATE, FERRITIN, TIBC, IRON, RETICCTPCT in the last 72 hours. Microbiology Recent Results (from the past 240 hour(s))  Resp Panel  by RT-PCR (Flu A&B, Covid) Nasopharyngeal Swab     Status: None   Collection Time: 07/07/21  1:32 PM   Specimen: Nasopharyngeal Swab; Nasopharyngeal(NP) swabs in vial transport medium  Result Value Ref Range Status   SARS Coronavirus 2 by RT PCR NEGATIVE NEGATIVE Final    Comment: (NOTE) SARS-CoV-2 target nucleic acids are NOT DETECTED.  The SARS-CoV-2 RNA is generally detectable in upper respiratory specimens during the acute phase of infection. The lowest concentration of SARS-CoV-2 viral copies this assay can detect is 138 copies/mL. A negative result does not preclude SARS-Cov-2 infection and should not be used as the sole basis for treatment or other patient management decisions. A negative result may occur with  improper specimen collection/handling, submission of specimen other than nasopharyngeal swab, presence of viral mutation(s) within the areas targeted by this assay, and inadequate number of viral copies(<138 copies/mL). A negative result must be combined with clinical observations, patient history, and epidemiological information. The expected result is Negative.  Fact Sheet for Patients:  BloggerCourse.com  Fact Sheet for Healthcare Providers:  SeriousBroker.it  This test is no t yet approved or cleared by the [place] FDA and  has been authorized for detection and/or diagnosis of SARS-CoV-2 by FDA under an Emergency Use Authorization (EUA). This EUA will remain  in effect (meaning this test can be used) for the duration of the COVID-19 declaration under Section 564(b)(1) of the Act, 21 U.S.C.section 360bbb-3(b)(1), unless the authorization is terminated  or revoked sooner.       Influenza A by PCR NEGATIVE NEGATIVE Final   Influenza B by PCR NEGATIVE NEGATIVE Final    Comment: (NOTE) The Xpert Xpress SARS-CoV-2/FLU/RSV plus assay is intended as an aid in the diagnosis of influenza from Nasopharyngeal swab  specimens and should not be used as a sole basis for treatment. Nasal washings and aspirates are unacceptable for Xpert Xpress SARS-CoV-2/FLU/RSV testing.  Fact Sheet for Patients: BloggerCourse.com  Fact Sheet for Healthcare Providers: SeriousBroker.it  This test is not yet approved or cleared by the [place] FDA and has been authorized for detection and/or diagnosis of SARS-CoV-2 by FDA under an Emergency Use Authorization (EUA). This EUA will remain in effect (meaning this test can be used) for the duration of the COVID-19 declaration under Section 564(b)(1) of the Act, 21 U.S.C. section 360bbb-3(b)(1), unless the authorization is terminated or revoked.  Performed at [facility], [address] [place], [place] [address]      Discharge Instructions:   Discharge Instructions     Call MD for:  redness, tenderness, or signs of infection (pain, swelling, redness, odor or green/yellow discharge around incision site)   Complete by: As directed    Call MD for:  severe uncontrolled pain   Complete by: As directed    Call MD for:  temperature >100.4   Complete by: As directed    Diet - low sodium heart healthy   Complete by: As directed    Discharge instructions   Complete by: As directed    Follow-up with your primary care provider in 1 week.  Follow-up with orthopedics in 2 weeks for surgical follow-up for wound check and radiographs.   Discharge wound care:   Complete by: As directed    Reinforce dressing.   Increase activity slowly   Complete by: As directed       Allergies as of 07/10/2021       Reactions   Statins    Muscle aches with all Statins        Medication List     STOP taking these medications    losartan-hydrochlorothiazide 50-12.5 MG tablet Commonly known as: HYZAAR       TAKE these medications    amLODipine 5 MG tablet Commonly known as: NORVASC Take 5 mg by  mouth daily.   aspirin 81 MG chewable tablet Commonly known as: Aspirin Childrens Chew 1 tablet (81 mg total) by mouth 2 (two) times daily.   fenofibrate 54 MG tablet Take 54 mg by mouth daily.   folic acid 400 MCG tablet Commonly known as: FOLVITE Take 400 mcg by mouth 3 (three) times a week. No specific days   hydrochlorothiazide 25 MG tablet Commonly known as: HYDRODIURIL Take 25 mg by mouth daily.   HYDROcodone-acetaminophen 5-325 MG tablet Commonly known as: NORCO/VICODIN Take 1-2 tablets by mouth every 6 (six) hours as needed for moderate pain.   LORazepam 1 MG tablet Commonly known as: ATIVAN Take 1 mg by mouth See admin instructions.  oral at bedtime  oral daily as needed for anxiety   losartan 100 MG tablet Commonly known as: COZAAR Take 100 mg by mouth daily.   ondansetron 4 MG tablet Commonly known as: ZOFRAN Take 1 tablet (4 mg total) by mouth every 6 (six) hours as needed for nausea.   polyethylene glycol 17 g packet Commonly known as: MIRALAX / GLYCOLAX Take 17 g by mouth daily as needed for mild constipation.   polyvinyl alcohol 1.4 % ophthalmic solution Commonly known as: LIQUIFILM TEARS Place 1 drop into both eyes as needed for dry eyes.   pravastatin 40 MG tablet Commonly known as: PRAVACHOL Take 40 mg by mouth daily.   sertraline 50 MG tablet Commonly known as: ZOLOFT Take 50 mg by mouth daily.   triamcinolone cream 0.1 % Commonly known as: KENALOG Apply 1 application topically 2 (two) times daily as needed for itching.   VITAMIN C PO Take 1 tablet by mouth daily.   VITAMIN D PO Take 1 capsule by mouth daily.               Discharge Care Instructions  (From admission, onward)           Start     Ordered   07/10/21 0000  Discharge wound care:       Comments: Reinforce dressing.   07/10/21 1610            Follow-up Information     Swinteck, Arlys John, MD. Schedule an appointment as soon as possible for a visit  in 2 week(s).   Specialty: Orthopedic Surgery Why: For wound re-check Contact information: [address] [place] [address] [phone]  Merri Brunette, MD Follow up in 1 week(s).   Specialty: Family Medicine Why: blood work, regular checkup Contact information: [address] [place] [address] [phone]                  Time coordinating discharge: 39 minutes  Signed:  Shaleka Brines  [facility] 07/10/2021, 8:24 AM

## 2021-07-10 NOTE — Progress Notes (Signed)
Physical Therapy Treatment Patient Details Name: Jessica Anthony MRN: [id number] DOB: Sep 13, 1945 Today's Date: 07/10/2021   History of Present Illness Pt s/p fall with R hip fx and now s/p R THR by anterior direct approach.    PT Comments    Pt with c/o increased stiffness/soreness this am but continues to progress with mobility.  Pt's s/o present to review bed mobility, car transfers, gait and HEP and states he feels comfortable with ability to assist pt at home.    Recommendations for follow up therapy are one component of a multi-disciplinary discharge planning process, led by the attending physician.  Recommendations may be updated based on patient status, additional functional criteria and insurance authorization.  Follow Up Recommendations  Follow physician's recommendations for discharge plan and follow up therapies     Assistance Recommended at Discharge Frequent or constant Supervision/Assistance  Equipment Recommendations  Rolling walker (2 wheels)    Recommendations for Other Services       Precautions / Restrictions Precautions Precautions: Fall Restrictions Weight Bearing Restrictions: No Other Position/Activity Restrictions: WBAT     Mobility  Bed Mobility Overal bed mobility: Needs Assistance Bed Mobility: Supine to Sit     Supine to sit: Min guard     General bed mobility comments: Increased time with cues for sequence and use of L LE to self assist    Transfers Overall transfer level: Needs assistance Equipment used: Rolling walker (2 wheels) Transfers: Sit to/from Stand Sit to Stand: Min guard;Supervision           General transfer comment: cues for LE management and use of UEs to self assist.    Ambulation/Gait Ambulation/Gait assistance: Min guard;Supervision Gait Distance (Feet): 65 Feet Assistive device: Rolling walker (2 wheels) Gait Pattern/deviations: Step-to pattern;Decreased step length - right;Decreased step length -  left;Shuffle;Trunk flexed Gait velocity: decr     General Gait Details: Increased time with cues for posture, position from RW and initial sequence   Stairs             Wheelchair Mobility    Modified Rankin (Stroke Patients Only)       Balance Overall balance assessment: Needs assistance Sitting-balance support: No upper extremity supported;Feet supported Sitting balance-Leahy Scale: Good     Standing balance support: No upper extremity supported Standing balance-Leahy Scale: Fair                              Cognition Arousal/Alertness: Awake/alert Behavior During Therapy: WFL for tasks assessed/performed Overall Cognitive Status: Within Functional Limits for tasks assessed                                 General Comments: some STM deficits noted, baseline?        Exercises Total Joint Exercises Ankle Circles/Pumps: AROM;Both;15 reps;Supine Quad Sets: AROM;Both;10 reps;Supine Heel Slides: AAROM;Right;20 reps;Supine Hip ABduction/ADduction: AAROM;Right;15 reps;Supine    General Comments        Pertinent Vitals/Pain Pain Assessment: 0-10 Pain Score: 4  Pain Location: R hip Pain Descriptors / Indicators: Sore Pain Intervention(s): Limited activity within patient's tolerance;Monitored during session;Premedicated before session    Home Living                          Prior Function            PT Goals (current goals  can now be found in the care plan section) Acute Rehab PT Goals Patient Stated Goal: Regain IND PT Goal Formulation: With patient Time For Goal Achievement: 07/16/21 Potential to Achieve Goals: Good Progress towards PT goals: Progressing toward goals    Frequency    7X/week      PT Plan Current plan remains appropriate    Co-evaluation              AM-PAC PT "6 Clicks" Mobility   Outcome Measure  Help needed turning from your back to your side while in a flat bed without using  bedrails?: A Little Help needed moving from lying on your back to sitting on the side of a flat bed without using bedrails?: A Little Help needed moving to and from a bed to a chair (including a wheelchair)?: A Little Help needed standing up from a chair using your arms (e.g., wheelchair or bedside chair)?: A Little Help needed to walk in hospital room?: A Little Help needed climbing 3-5 steps with a railing? : A Lot 6 Click Score: 17    End of Session Equipment Utilized During Treatment: Gait belt Activity Tolerance: Patient tolerated treatment well Patient left: with call bell/phone within reach;with family/visitor present;in chair Nurse Communication: Mobility status PT Visit Diagnosis: Difficulty in walking, not elsewhere classified (R26.2)     Time: 8119-1478 PT Time Calculation (min) (ACUTE ONLY): 43 min  Charges:  $Gait Training: 8-22 mins $Therapeutic Exercise: 8-22 mins $Therapeutic Activity: 8-22 mins                     Mauro Kaufmann PT Acute Rehabilitation Services Pager [phone] Office [phone]    [name] 07/10/2021, 12:39 PM

## 2021-07-13 ENCOUNTER — Encounter (HOSPITAL_COMMUNITY): Payer: Self-pay | Admitting: Orthopedic Surgery

## 2021-07-13 DIAGNOSIS — I10 Essential (primary) hypertension: Secondary | ICD-10-CM | POA: Diagnosis not present

## 2021-07-13 DIAGNOSIS — Z7982 Long term (current) use of aspirin: Secondary | ICD-10-CM | POA: Diagnosis not present

## 2021-07-13 DIAGNOSIS — F32A Depression, unspecified: Secondary | ICD-10-CM | POA: Diagnosis not present

## 2021-07-13 DIAGNOSIS — Z471 Aftercare following joint replacement surgery: Secondary | ICD-10-CM | POA: Diagnosis not present

## 2021-07-13 DIAGNOSIS — Z96641 Presence of right artificial hip joint: Secondary | ICD-10-CM | POA: Diagnosis not present

## 2021-07-13 DIAGNOSIS — Z9181 History of falling: Secondary | ICD-10-CM | POA: Diagnosis not present

## 2021-07-13 DIAGNOSIS — K5900 Constipation, unspecified: Secondary | ICD-10-CM | POA: Diagnosis not present

## 2021-07-13 DIAGNOSIS — E785 Hyperlipidemia, unspecified: Secondary | ICD-10-CM | POA: Diagnosis not present

## 2021-07-15 DIAGNOSIS — Z9181 History of falling: Secondary | ICD-10-CM | POA: Diagnosis not present

## 2021-07-15 DIAGNOSIS — K5900 Constipation, unspecified: Secondary | ICD-10-CM | POA: Diagnosis not present

## 2021-07-15 DIAGNOSIS — Z7982 Long term (current) use of aspirin: Secondary | ICD-10-CM | POA: Diagnosis not present

## 2021-07-15 DIAGNOSIS — I10 Essential (primary) hypertension: Secondary | ICD-10-CM | POA: Diagnosis not present

## 2021-07-15 DIAGNOSIS — Z471 Aftercare following joint replacement surgery: Secondary | ICD-10-CM | POA: Diagnosis not present

## 2021-07-15 DIAGNOSIS — Z96641 Presence of right artificial hip joint: Secondary | ICD-10-CM | POA: Diagnosis not present

## 2021-07-15 DIAGNOSIS — E785 Hyperlipidemia, unspecified: Secondary | ICD-10-CM | POA: Diagnosis not present

## 2021-07-15 DIAGNOSIS — F32A Depression, unspecified: Secondary | ICD-10-CM | POA: Diagnosis not present

## 2021-07-19 DIAGNOSIS — I10 Essential (primary) hypertension: Secondary | ICD-10-CM | POA: Diagnosis not present

## 2021-07-19 DIAGNOSIS — Z9181 History of falling: Secondary | ICD-10-CM | POA: Diagnosis not present

## 2021-07-19 DIAGNOSIS — Z471 Aftercare following joint replacement surgery: Secondary | ICD-10-CM | POA: Diagnosis not present

## 2021-07-19 DIAGNOSIS — Z7982 Long term (current) use of aspirin: Secondary | ICD-10-CM | POA: Diagnosis not present

## 2021-07-19 DIAGNOSIS — F32A Depression, unspecified: Secondary | ICD-10-CM | POA: Diagnosis not present

## 2021-07-19 DIAGNOSIS — E785 Hyperlipidemia, unspecified: Secondary | ICD-10-CM | POA: Diagnosis not present

## 2021-07-19 DIAGNOSIS — K5900 Constipation, unspecified: Secondary | ICD-10-CM | POA: Diagnosis not present

## 2021-07-19 DIAGNOSIS — Z96641 Presence of right artificial hip joint: Secondary | ICD-10-CM | POA: Diagnosis not present

## 2021-07-21 DIAGNOSIS — Z9181 History of falling: Secondary | ICD-10-CM | POA: Diagnosis not present

## 2021-07-21 DIAGNOSIS — K5900 Constipation, unspecified: Secondary | ICD-10-CM | POA: Diagnosis not present

## 2021-07-21 DIAGNOSIS — F32A Depression, unspecified: Secondary | ICD-10-CM | POA: Diagnosis not present

## 2021-07-21 DIAGNOSIS — Z7982 Long term (current) use of aspirin: Secondary | ICD-10-CM | POA: Diagnosis not present

## 2021-07-21 DIAGNOSIS — Z96641 Presence of right artificial hip joint: Secondary | ICD-10-CM | POA: Diagnosis not present

## 2021-07-21 DIAGNOSIS — Z471 Aftercare following joint replacement surgery: Secondary | ICD-10-CM | POA: Diagnosis not present

## 2021-07-21 DIAGNOSIS — E785 Hyperlipidemia, unspecified: Secondary | ICD-10-CM | POA: Diagnosis not present

## 2021-07-21 DIAGNOSIS — I10 Essential (primary) hypertension: Secondary | ICD-10-CM | POA: Diagnosis not present

## 2021-07-23 DIAGNOSIS — S72031D Displaced midcervical fracture of right femur, subsequent encounter for closed fracture with routine healing: Secondary | ICD-10-CM | POA: Diagnosis not present

## 2021-07-26 DIAGNOSIS — Z96641 Presence of right artificial hip joint: Secondary | ICD-10-CM | POA: Diagnosis not present

## 2021-07-26 DIAGNOSIS — Z9181 History of falling: Secondary | ICD-10-CM | POA: Diagnosis not present

## 2021-07-26 DIAGNOSIS — Z7982 Long term (current) use of aspirin: Secondary | ICD-10-CM | POA: Diagnosis not present

## 2021-07-26 DIAGNOSIS — Z471 Aftercare following joint replacement surgery: Secondary | ICD-10-CM | POA: Diagnosis not present

## 2021-07-26 DIAGNOSIS — I10 Essential (primary) hypertension: Secondary | ICD-10-CM | POA: Diagnosis not present

## 2021-07-26 DIAGNOSIS — K5900 Constipation, unspecified: Secondary | ICD-10-CM | POA: Diagnosis not present

## 2021-07-26 DIAGNOSIS — F32A Depression, unspecified: Secondary | ICD-10-CM | POA: Diagnosis not present

## 2021-07-26 DIAGNOSIS — E785 Hyperlipidemia, unspecified: Secondary | ICD-10-CM | POA: Diagnosis not present

## 2021-07-27 DIAGNOSIS — K5900 Constipation, unspecified: Secondary | ICD-10-CM | POA: Diagnosis not present

## 2021-07-27 DIAGNOSIS — Z7982 Long term (current) use of aspirin: Secondary | ICD-10-CM | POA: Diagnosis not present

## 2021-07-27 DIAGNOSIS — F32A Depression, unspecified: Secondary | ICD-10-CM | POA: Diagnosis not present

## 2021-07-27 DIAGNOSIS — Z96641 Presence of right artificial hip joint: Secondary | ICD-10-CM | POA: Diagnosis not present

## 2021-07-27 DIAGNOSIS — Z9181 History of falling: Secondary | ICD-10-CM | POA: Diagnosis not present

## 2021-07-27 DIAGNOSIS — I10 Essential (primary) hypertension: Secondary | ICD-10-CM | POA: Diagnosis not present

## 2021-07-27 DIAGNOSIS — E785 Hyperlipidemia, unspecified: Secondary | ICD-10-CM | POA: Diagnosis not present

## 2021-07-27 DIAGNOSIS — Z471 Aftercare following joint replacement surgery: Secondary | ICD-10-CM | POA: Diagnosis not present

## 2021-07-28 DIAGNOSIS — E785 Hyperlipidemia, unspecified: Secondary | ICD-10-CM | POA: Diagnosis not present

## 2021-07-28 DIAGNOSIS — Z96641 Presence of right artificial hip joint: Secondary | ICD-10-CM | POA: Diagnosis not present

## 2021-07-28 DIAGNOSIS — I10 Essential (primary) hypertension: Secondary | ICD-10-CM | POA: Diagnosis not present

## 2021-07-28 DIAGNOSIS — Z7982 Long term (current) use of aspirin: Secondary | ICD-10-CM | POA: Diagnosis not present

## 2021-07-28 DIAGNOSIS — K5900 Constipation, unspecified: Secondary | ICD-10-CM | POA: Diagnosis not present

## 2021-07-28 DIAGNOSIS — F32A Depression, unspecified: Secondary | ICD-10-CM | POA: Diagnosis not present

## 2021-07-28 DIAGNOSIS — Z471 Aftercare following joint replacement surgery: Secondary | ICD-10-CM | POA: Diagnosis not present

## 2021-07-28 DIAGNOSIS — Z9181 History of falling: Secondary | ICD-10-CM | POA: Diagnosis not present

## 2021-08-03 DIAGNOSIS — Z7982 Long term (current) use of aspirin: Secondary | ICD-10-CM | POA: Diagnosis not present

## 2021-08-03 DIAGNOSIS — F32A Depression, unspecified: Secondary | ICD-10-CM | POA: Diagnosis not present

## 2021-08-03 DIAGNOSIS — K5900 Constipation, unspecified: Secondary | ICD-10-CM | POA: Diagnosis not present

## 2021-08-03 DIAGNOSIS — E785 Hyperlipidemia, unspecified: Secondary | ICD-10-CM | POA: Diagnosis not present

## 2021-08-03 DIAGNOSIS — Z471 Aftercare following joint replacement surgery: Secondary | ICD-10-CM | POA: Diagnosis not present

## 2021-08-03 DIAGNOSIS — Z96641 Presence of right artificial hip joint: Secondary | ICD-10-CM | POA: Diagnosis not present

## 2021-08-03 DIAGNOSIS — I10 Essential (primary) hypertension: Secondary | ICD-10-CM | POA: Diagnosis not present

## 2021-08-03 DIAGNOSIS — Z9181 History of falling: Secondary | ICD-10-CM | POA: Diagnosis not present

## 2021-08-04 DIAGNOSIS — Z9181 History of falling: Secondary | ICD-10-CM | POA: Diagnosis not present

## 2021-08-04 DIAGNOSIS — Z96641 Presence of right artificial hip joint: Secondary | ICD-10-CM | POA: Diagnosis not present

## 2021-08-04 DIAGNOSIS — Z471 Aftercare following joint replacement surgery: Secondary | ICD-10-CM | POA: Diagnosis not present

## 2021-08-04 DIAGNOSIS — Z7982 Long term (current) use of aspirin: Secondary | ICD-10-CM | POA: Diagnosis not present

## 2021-08-04 DIAGNOSIS — K5900 Constipation, unspecified: Secondary | ICD-10-CM | POA: Diagnosis not present

## 2021-08-04 DIAGNOSIS — I10 Essential (primary) hypertension: Secondary | ICD-10-CM | POA: Diagnosis not present

## 2021-08-04 DIAGNOSIS — F32A Depression, unspecified: Secondary | ICD-10-CM | POA: Diagnosis not present

## 2021-08-04 DIAGNOSIS — E785 Hyperlipidemia, unspecified: Secondary | ICD-10-CM | POA: Diagnosis not present

## 2021-08-11 DIAGNOSIS — Z7982 Long term (current) use of aspirin: Secondary | ICD-10-CM | POA: Diagnosis not present

## 2021-08-11 DIAGNOSIS — Z9181 History of falling: Secondary | ICD-10-CM | POA: Diagnosis not present

## 2021-08-11 DIAGNOSIS — Z96641 Presence of right artificial hip joint: Secondary | ICD-10-CM | POA: Diagnosis not present

## 2021-08-11 DIAGNOSIS — K5900 Constipation, unspecified: Secondary | ICD-10-CM | POA: Diagnosis not present

## 2021-08-11 DIAGNOSIS — F32A Depression, unspecified: Secondary | ICD-10-CM | POA: Diagnosis not present

## 2021-08-11 DIAGNOSIS — I10 Essential (primary) hypertension: Secondary | ICD-10-CM | POA: Diagnosis not present

## 2021-08-11 DIAGNOSIS — E785 Hyperlipidemia, unspecified: Secondary | ICD-10-CM | POA: Diagnosis not present

## 2021-08-11 DIAGNOSIS — Z471 Aftercare following joint replacement surgery: Secondary | ICD-10-CM | POA: Diagnosis not present

## 2021-08-12 DIAGNOSIS — I10 Essential (primary) hypertension: Secondary | ICD-10-CM | POA: Diagnosis not present

## 2021-08-12 DIAGNOSIS — Z9181 History of falling: Secondary | ICD-10-CM | POA: Diagnosis not present

## 2021-08-12 DIAGNOSIS — K5900 Constipation, unspecified: Secondary | ICD-10-CM | POA: Diagnosis not present

## 2021-08-12 DIAGNOSIS — E785 Hyperlipidemia, unspecified: Secondary | ICD-10-CM | POA: Diagnosis not present

## 2021-08-12 DIAGNOSIS — F32A Depression, unspecified: Secondary | ICD-10-CM | POA: Diagnosis not present

## 2021-08-12 DIAGNOSIS — Z96641 Presence of right artificial hip joint: Secondary | ICD-10-CM | POA: Diagnosis not present

## 2021-08-12 DIAGNOSIS — Z7982 Long term (current) use of aspirin: Secondary | ICD-10-CM | POA: Diagnosis not present

## 2021-08-12 DIAGNOSIS — Z471 Aftercare following joint replacement surgery: Secondary | ICD-10-CM | POA: Diagnosis not present

## 2021-08-19 DIAGNOSIS — E785 Hyperlipidemia, unspecified: Secondary | ICD-10-CM | POA: Diagnosis not present

## 2021-08-19 DIAGNOSIS — Z471 Aftercare following joint replacement surgery: Secondary | ICD-10-CM | POA: Diagnosis not present

## 2021-08-19 DIAGNOSIS — F32A Depression, unspecified: Secondary | ICD-10-CM | POA: Diagnosis not present

## 2021-08-19 DIAGNOSIS — Z7982 Long term (current) use of aspirin: Secondary | ICD-10-CM | POA: Diagnosis not present

## 2021-08-19 DIAGNOSIS — Z9181 History of falling: Secondary | ICD-10-CM | POA: Diagnosis not present

## 2021-08-19 DIAGNOSIS — Z96641 Presence of right artificial hip joint: Secondary | ICD-10-CM | POA: Diagnosis not present

## 2021-08-19 DIAGNOSIS — K5900 Constipation, unspecified: Secondary | ICD-10-CM | POA: Diagnosis not present

## 2021-08-19 DIAGNOSIS — I10 Essential (primary) hypertension: Secondary | ICD-10-CM | POA: Diagnosis not present

## 2021-08-20 DIAGNOSIS — S72031D Displaced midcervical fracture of right femur, subsequent encounter for closed fracture with routine healing: Secondary | ICD-10-CM | POA: Diagnosis not present

## 2021-08-23 DIAGNOSIS — E785 Hyperlipidemia, unspecified: Secondary | ICD-10-CM | POA: Diagnosis not present

## 2021-08-23 DIAGNOSIS — Z471 Aftercare following joint replacement surgery: Secondary | ICD-10-CM | POA: Diagnosis not present

## 2021-08-23 DIAGNOSIS — Z7982 Long term (current) use of aspirin: Secondary | ICD-10-CM | POA: Diagnosis not present

## 2021-08-23 DIAGNOSIS — I10 Essential (primary) hypertension: Secondary | ICD-10-CM | POA: Diagnosis not present

## 2021-08-23 DIAGNOSIS — K5900 Constipation, unspecified: Secondary | ICD-10-CM | POA: Diagnosis not present

## 2021-08-23 DIAGNOSIS — Z96641 Presence of right artificial hip joint: Secondary | ICD-10-CM | POA: Diagnosis not present

## 2021-08-23 DIAGNOSIS — Z9181 History of falling: Secondary | ICD-10-CM | POA: Diagnosis not present

## 2021-08-23 DIAGNOSIS — F32A Depression, unspecified: Secondary | ICD-10-CM | POA: Diagnosis not present

## 2021-10-07 DIAGNOSIS — E7800 Pure hypercholesterolemia, unspecified: Secondary | ICD-10-CM | POA: Diagnosis not present

## 2021-10-07 DIAGNOSIS — I10 Essential (primary) hypertension: Secondary | ICD-10-CM | POA: Diagnosis not present

## 2021-10-07 DIAGNOSIS — M85851 Other specified disorders of bone density and structure, right thigh: Secondary | ICD-10-CM | POA: Diagnosis not present

## 2021-10-27 ENCOUNTER — Emergency Department (HOSPITAL_COMMUNITY): Payer: Medicare Other

## 2021-10-27 ENCOUNTER — Other Ambulatory Visit: Payer: Self-pay

## 2021-10-27 ENCOUNTER — Inpatient Hospital Stay (HOSPITAL_COMMUNITY)
Admission: EM | Admit: 2021-10-27 | Discharge: 2021-11-02 | DRG: 494 | Disposition: A | Discharge status: Skilled Nursing Facility | Payer: Medicare Other | Attending: Internal Medicine | Admitting: Internal Medicine

## 2021-10-27 ENCOUNTER — Encounter (HOSPITAL_COMMUNITY): Payer: Self-pay

## 2021-10-27 DIAGNOSIS — S8251XA Displaced fracture of medial malleolus of right tibia, initial encounter for closed fracture: Secondary | ICD-10-CM | POA: Diagnosis not present

## 2021-10-27 DIAGNOSIS — S82891D Other fracture of right lower leg, subsequent encounter for closed fracture with routine healing: Secondary | ICD-10-CM | POA: Diagnosis not present

## 2021-10-27 DIAGNOSIS — Z20822 Contact with and (suspected) exposure to covid-19: Secondary | ICD-10-CM | POA: Diagnosis not present

## 2021-10-27 DIAGNOSIS — S82899A Other fracture of unspecified lower leg, initial encounter for closed fracture: Secondary | ICD-10-CM | POA: Diagnosis present

## 2021-10-27 DIAGNOSIS — S72009A Fracture of unspecified part of neck of unspecified femur, initial encounter for closed fracture: Secondary | ICD-10-CM | POA: Diagnosis not present

## 2021-10-27 DIAGNOSIS — Z79899 Other long term (current) drug therapy: Secondary | ICD-10-CM

## 2021-10-27 DIAGNOSIS — Z8051 Family history of malignant neoplasm of kidney: Secondary | ICD-10-CM

## 2021-10-27 DIAGNOSIS — Y92013 Bedroom of single-family (private) house as the place of occurrence of the external cause: Secondary | ICD-10-CM | POA: Diagnosis not present

## 2021-10-27 DIAGNOSIS — Z8249 Family history of ischemic heart disease and other diseases of the circulatory system: Secondary | ICD-10-CM

## 2021-10-27 DIAGNOSIS — W19XXXA Unspecified fall, initial encounter: Secondary | ICD-10-CM | POA: Diagnosis not present

## 2021-10-27 DIAGNOSIS — F32A Depression, unspecified: Secondary | ICD-10-CM | POA: Diagnosis present

## 2021-10-27 DIAGNOSIS — E876 Hypokalemia: Secondary | ICD-10-CM | POA: Diagnosis not present

## 2021-10-27 DIAGNOSIS — G8918 Other acute postprocedural pain: Secondary | ICD-10-CM | POA: Diagnosis not present

## 2021-10-27 DIAGNOSIS — W06XXXA Fall from bed, initial encounter: Secondary | ICD-10-CM | POA: Diagnosis present

## 2021-10-27 DIAGNOSIS — Z880 Allergy status to penicillin: Secondary | ICD-10-CM

## 2021-10-27 DIAGNOSIS — K5900 Constipation, unspecified: Secondary | ICD-10-CM | POA: Diagnosis not present

## 2021-10-27 DIAGNOSIS — Z800 Family history of malignant neoplasm of digestive organs: Secondary | ICD-10-CM | POA: Diagnosis not present

## 2021-10-27 DIAGNOSIS — S82851A Displaced trimalleolar fracture of right lower leg, initial encounter for closed fracture: Secondary | ICD-10-CM | POA: Diagnosis not present

## 2021-10-27 DIAGNOSIS — Z888 Allergy status to other drugs, medicaments and biological substances status: Secondary | ICD-10-CM

## 2021-10-27 DIAGNOSIS — Z96641 Presence of right artificial hip joint: Secondary | ICD-10-CM | POA: Diagnosis not present

## 2021-10-27 DIAGNOSIS — S93431A Sprain of tibiofibular ligament of right ankle, initial encounter: Secondary | ICD-10-CM | POA: Diagnosis not present

## 2021-10-27 DIAGNOSIS — Z043 Encounter for examination and observation following other accident: Secondary | ICD-10-CM | POA: Diagnosis not present

## 2021-10-27 DIAGNOSIS — F419 Anxiety disorder, unspecified: Secondary | ICD-10-CM | POA: Diagnosis present

## 2021-10-27 DIAGNOSIS — R531 Weakness: Secondary | ICD-10-CM | POA: Diagnosis not present

## 2021-10-27 DIAGNOSIS — S82851D Displaced trimalleolar fracture of right lower leg, subsequent encounter for closed fracture with routine healing: Secondary | ICD-10-CM | POA: Diagnosis not present

## 2021-10-27 DIAGNOSIS — Z823 Family history of stroke: Secondary | ICD-10-CM | POA: Diagnosis not present

## 2021-10-27 DIAGNOSIS — M7989 Other specified soft tissue disorders: Secondary | ICD-10-CM | POA: Diagnosis not present

## 2021-10-27 DIAGNOSIS — E785 Hyperlipidemia, unspecified: Secondary | ICD-10-CM | POA: Diagnosis present

## 2021-10-27 DIAGNOSIS — S82891A Other fracture of right lower leg, initial encounter for closed fracture: Secondary | ICD-10-CM | POA: Diagnosis not present

## 2021-10-27 DIAGNOSIS — I10 Essential (primary) hypertension: Secondary | ICD-10-CM | POA: Diagnosis not present

## 2021-10-27 DIAGNOSIS — M8580 Other specified disorders of bone density and structure, unspecified site: Secondary | ICD-10-CM | POA: Diagnosis present

## 2021-10-27 DIAGNOSIS — Z7401 Bed confinement status: Secondary | ICD-10-CM | POA: Diagnosis not present

## 2021-10-27 DIAGNOSIS — S99911A Unspecified injury of right ankle, initial encounter: Secondary | ICD-10-CM | POA: Diagnosis not present

## 2021-10-27 DIAGNOSIS — Z743 Need for continuous supervision: Secondary | ICD-10-CM | POA: Diagnosis not present

## 2021-10-27 DIAGNOSIS — S0990XA Unspecified injury of head, initial encounter: Secondary | ICD-10-CM | POA: Diagnosis not present

## 2021-10-27 LAB — CBC WITH DIFFERENTIAL/PLATELET
Abs Immature Granulocytes: 0.06 10*3/uL (ref 0.00–0.07)
Basophils Absolute: 0 10*3/uL (ref 0.0–0.1)
Basophils Relative: 0 %
Eosinophils Absolute: 0.2 10*3/uL (ref 0.0–0.5)
Eosinophils Relative: 2 %
HCT: 42.1 % (ref 36.0–46.0)
Hemoglobin: 13.5 g/dL (ref 12.0–15.0)
Immature Granulocytes: 1 %
Lymphocytes Relative: 17 %
Lymphs Abs: 1.7 10*3/uL (ref 0.7–4.0)
MCH: 28.2 pg (ref 26.0–34.0)
MCHC: 32.1 g/dL (ref 30.0–36.0)
MCV: 87.9 fL (ref 80.0–100.0)
Monocytes Absolute: 1 10*3/uL (ref 0.1–1.0)
Monocytes Relative: 9 %
Neutro Abs: 7.5 10*3/uL (ref 1.7–7.7)
Neutrophils Relative %: 71 %
Platelets: 251 10*3/uL (ref 150–400)
RBC: 4.79 MIL/uL (ref 3.87–5.11)
RDW: 13.9 % (ref 11.5–15.5)
WBC: 10.5 10*3/uL (ref 4.0–10.5)
nRBC: 0 % (ref 0.0–0.2)

## 2021-10-27 LAB — BASIC METABOLIC PANEL
Anion gap: 6 (ref 5–15)
BUN: 20 mg/dL (ref 8–23)
CO2: 28 mmol/L (ref 22–32)
Calcium: 9.2 mg/dL (ref 8.9–10.3)
Chloride: 105 mmol/L (ref 98–111)
Creatinine, Ser: 0.8 mg/dL (ref 0.44–1.00)
GFR, Estimated: >60 mL/min (ref >60)
Glucose, Bld: 111 mg/dL — ABNORMAL HIGH (ref 70–99)
Potassium: 3.2 mmol/L — ABNORMAL LOW (ref 3.5–5.1)
Sodium: 139 mmol/L (ref 135–145)

## 2021-10-27 LAB — TROPONIN I (HIGH SENSITIVITY): Troponin I (High Sensitivity): 3 ng/L (ref <18)

## 2021-10-27 MED ORDER — MORPHINE SULFATE (PF) 4 MG/ML IV SOLN
4.0000 mg | Freq: Once | INTRAVENOUS | Status: AC | PRN
  Administered 2021-10-28: 4 mg via INTRAVENOUS
  Filled 2021-10-27: qty 1

## 2021-10-27 MED ORDER — POTASSIUM CHLORIDE CRYS ER 20 MEQ PO TBCR
40.0000 meq | EXTENDED_RELEASE_TABLET | Freq: Once | ORAL | Status: AC
  Administered 2021-10-27: 40 meq via ORAL
  Filled 2021-10-27: qty 2

## 2021-10-27 MED ORDER — ONDANSETRON HCL 4 MG/2ML IJ SOLN
4.0000 mg | Freq: Once | INTRAMUSCULAR | Status: AC | PRN
  Administered 2021-10-28: 4 mg via INTRAVENOUS
  Filled 2021-10-27: qty 2

## 2021-10-27 NOTE — ED Provider Notes (Signed)
23:00: Assumed care of patient at change of shift pending orthopedic consultation and admission. ? ?Please see prior provider note for full H&P.  Briefly patient is a 76 year old female who presented after a fall, unclear mechanism, with complaints of right ankle pain. ? ?Patient was found to have a trimalleolar fracture. ?Plan @ shift change is to discuss case with orthopedic surgery on-call for [facility] & admit to hospital service due to patient unsafe on crutches/at home. ? ?23:30: CONSULT: Discussed with orthopedic surgeon Dr. Wynelle Link - recommends splinting and ortho team will see in AM. Appreciate input.  ? ?On exam patient has bruising and swelling to the right ankle.  She is neurovascularly intact distally. ? ?I performed some manipulation during splinting by orthopedic technician status post morphine.  ? ?SPLINT APPLICATION ?Date/Time: 10/28/21 @ 00:20.  ?Authorized by: Kennith Maes ?Consent: Verbal consent obtained. ?Risks and benefits: risks, benefits and alternatives were discussed ?Consent given by: patient ?Splint applied by: orthopedic technician ?Location details: [facility] ?Splint type: short leg splint ?Post-procedure: The splinted body part was neurovascularly unchanged following the procedure. ?Patient tolerance: Patient tolerated the procedure well with no immediate complications. ? ?Discussed with hospitalist Dr. Flossie Buffy- accepts admission.  ? ?Medical Decision Making ?Amount and/or Complexity of Data Reviewed ?Labs: ordered. ?Radiology: ordered. ? ?Risk ?Prescription drug management. ?Decision regarding hospitalization. ? ? ?Results for orders placed or performed during the hospital encounter of 10/27/21  ?Basic metabolic panel  ?Result Value Ref Range  ? Sodium 139 135 - 145 mmol/L  ? Potassium 3.2 (L) 3.5 - 5.1 mmol/L  ? Chloride 105 98 - 111 mmol/L  ? CO2 28 22 - 32 mmol/L  ? Glucose, Bld 111 (H) 70 - 99 mg/dL  ? BUN 20 8 - 23 mg/dL  ? Creatinine, Ser 0.80 0.44 - 1.00 mg/dL  ? Calcium 9.2 8.9 -  10.3 mg/dL  ? GFR, Estimated >60 >60 mL/min  ? Anion gap 6 5 - 15  ?CBC with Differential  ?Result Value Ref Range  ? WBC 10.5 4.0 - 10.5 K/uL  ? RBC 4.79 3.87 - 5.11 MIL/uL  ? Hemoglobin 13.5 12.0 - 15.0 g/dL  ? HCT 42.1 36.0 - 46.0 %  ? MCV 87.9 80.0 - 100.0 fL  ? MCH 28.2 26.0 - 34.0 pg  ? MCHC 32.1 30.0 - 36.0 g/dL  ? RDW 13.9 11.5 - 15.5 %  ? Platelets 251 150 - 400 K/uL  ? nRBC 0.0 0.0 - 0.2 %  ? Neutrophils Relative % 71 %  ? Neutro Abs 7.5 1.7 - 7.7 K/uL  ? Lymphocytes Relative 17 %  ? Lymphs Abs 1.7 0.7 - 4.0 K/uL  ? Monocytes Relative 9 %  ? Monocytes Absolute 1.0 0.1 - 1.0 K/uL  ? Eosinophils Relative 2 %  ? Eosinophils Absolute 0.2 0.0 - 0.5 K/uL  ? Basophils Relative 0 %  ? Basophils Absolute 0.0 0.0 - 0.1 K/uL  ? Immature Granulocytes 1 %  ? Abs Immature Granulocytes 0.06 0.00 - 0.07 K/uL  ?Troponin I (High Sensitivity)  ?Result Value Ref Range  ? Troponin I (High Sensitivity) 3 <18 ng/L  ?Troponin I (High Sensitivity)  ?Result Value Ref Range  ? Troponin I (High Sensitivity) 2 <18 ng/L  ? ?DG Pelvis 1-2 Views ? ?Result Date: 10/27/2021 ?CLINICAL DATA:  Fall EXAM: PELVIS - 1-2 VIEW COMPARISON:  07/08/2021 FINDINGS: There is no evidence of pelvic fracture or diastasis. No pelvic bone lesions are seen. 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 IMPRESSION: Negative.  Electronically Signed   By: Ulyses Jarred M.D.   On: 10/27/2021 23:09  ? ?DG Ankle Complete Right ? ?Result Date: 10/27/2021 ?CLINICAL DATA:  Ankle pain. EXAM: RIGHT ANKLE - COMPLETE 3+ VIEW COMPARISON:  None. FINDINGS: There is an acute comminuted fracture of the distal fibular diaphysis above the level of the ankle mortise. There is apex anterior angulation. There is an acute vertically oriented fracture through the posterior malleolus, minimally displaced. There is an acute transverse comminuted fracture through the medial malleolus. There is 7 mm of medial and anterior subluxation of the distal tibia in relation to the talar dome.  There is soft tissue swelling surrounding the ankle. IMPRESSION: 1. Acute displaced trimalleolar ankle fracture. 2. Anteromedial displacement of the distal tibia in relation to the talus. Electronically Signed   By: Ronney Asters M.D.   On: 10/27/2021 20:58  ? ?CT HEAD WO CONTRAST (5MM) ? ?Result Date: 10/27/2021 ?CLINICAL DATA:  Trip and fall. EXAM: CT HEAD WITHOUT CONTRAST CT CERVICAL SPINE WITHOUT CONTRAST TECHNIQUE: Multidetector CT imaging of the head and cervical spine was performed following the standard protocol without intravenous contrast. Multiplanar CT image reconstructions of the cervical spine were also generated. RADIATION DOSE REDUCTION: This exam was performed according to the departmental dose-optimization program which includes automated exposure control, adjustment of the mA and/or kV according to patient size and/or use of iterative reconstruction technique. COMPARISON:  None. FINDINGS: CT HEAD FINDINGS Brain: No evidence of acute infarction, hemorrhage, hydrocephalus, extra-axial collection or mass lesion/mass effect. Vascular: Atherosclerotic calcifications are present within the cavernous internal carotid arteries. Skull: Normal. Negative for fracture or focal lesion. Sinuses/Orbits: No acute finding. Other: None. CT CERVICAL SPINE FINDINGS Alignment: Normal. Skull base and vertebrae: No acute fracture. No primary bone lesion or focal pathologic process. Soft tissues and spinal canal: No prevertebral fluid or swelling. No visible canal hematoma. Disc levels: There is intervertebral disc space narrowing and endplate osteophyte formation at C5-C6 and C6-C7 compatible with degenerative change. There is moderate right-sided neural foraminal stenosis at C6-C7 secondary to uncovertebral spurring. There is also mild central canal stenosis at this level secondary to posterior disc osteophyte complex. Upper chest: Negative. Other: None. IMPRESSION: No acute intracranial process. No acute fracture or  traumatic subluxation of the cervical spine. Electronically Signed   By: Ronney Asters M.D.   On: 10/27/2021 22:43  ? ?CT Cervical Spine Wo Contrast ? ?Result Date: 10/27/2021 ?CLINICAL DATA:  Trip and fall. EXAM: CT HEAD WITHOUT CONTRAST CT CERVICAL SPINE WITHOUT CONTRAST TECHNIQUE: Multidetector CT imaging of the head and cervical spine was performed following the standard protocol without intravenous contrast. Multiplanar CT image reconstructions of the cervical spine were also generated. RADIATION DOSE REDUCTION: This exam was performed according to the departmental dose-optimization program which includes automated exposure control, adjustment of the mA and/or kV according to patient size and/or use of iterative reconstruction technique. COMPARISON:  None. FINDINGS: CT HEAD FINDINGS Brain: No evidence of acute infarction, hemorrhage, hydrocephalus, extra-axial collection or mass lesion/mass effect. Vascular: Atherosclerotic calcifications are present within the cavernous internal carotid arteries. Skull: Normal. Negative for fracture or focal lesion. Sinuses/Orbits: No acute finding. Other: None. CT CERVICAL SPINE FINDINGS Alignment: Normal. Skull base and vertebrae: No acute fracture. No primary bone lesion or focal pathologic process. Soft tissues and spinal canal: No prevertebral fluid or swelling. No visible canal hematoma. Disc levels: There is intervertebral disc space narrowing and endplate osteophyte formation at C5-C6 and C6-C7 compatible with degenerative change. There is moderate right-sided  neural foraminal stenosis at C6-C7 secondary to uncovertebral spurring. There is also mild central canal stenosis at this level secondary to posterior disc osteophyte complex. Upper chest: Negative. Other: None. IMPRESSION: No acute intracranial process. No acute fracture or traumatic subluxation of the cervical spine. Electronically Signed   By: Ronney Asters M.D.   On: 10/27/2021 22:43  ? ?CT Ankle Right Wo  Contrast ? ?Result Date: 10/28/2021 ?CLINICAL DATA:  Trimalleolar ankle fracture. EXAM: CT OF THE RIGHT ANKLE WITHOUT CONTRAST TECHNIQUE: Multidetector CT imaging of the right ankle was performed according to t

## 2021-10-27 NOTE — ED Triage Notes (Signed)
Patient BIB EMS, tripped and fell tonight landing on her right ankle. Swelling and bruising noted, pedal pulses strong.  ?

## 2021-10-27 NOTE — ED Provider Notes (Signed)
?  Face-to-face evaluation ? ? ?History: Patient presents for injury to right ankle, when she fell, falling getting up from a nap.  She states the chair she was sitting and tumbled on top of her injuring her right ankle. ? ?Physical exam: Elderly female alert and cooperative.  Right ankle is tender and mildly swollen.  Neurovascular distally in the right toes.  No dysarthria or aphasia.  She is lucid but somewhat poor historian ? ?MDM: Evaluation for  ?Chief Complaint  ?Patient presents with  ? Ankle Pain  ? Fall  ?  ? ?Patient with fall, has unstable right ankle fracture requiring splinting in the ED.  Screening labs indicate mild hypokalemia.  She recently had a hip fracture and is somewhat debilitated from that.  She lives alone.  She needs surgical repair for the right ankle fracture. ? ?Medical screening examination/treatment/procedure(s) were conducted as a shared visit with non-physician practitioner(s) and myself.  I personally evaluated the patient during the encounter ? ?  ?Daleen Bo, MD ?10/28/21 2252 ? ?

## 2021-10-27 NOTE — ED Provider Notes (Signed)
?[facility] DEPT ?Provider Note ? ? ?CSN: [id number] ?Arrival date & time: 10/27/21  2006 ? ?  ? ?History ? ?Chief Complaint  ?Patient presents with  ? Ankle Pain  ? Fall  ? ? ?Jessica Anthony is a 76 y.o. female with a past medical history of hypertension, hyperlipidemia, right femoral neck fracture status post surgical repair by Dr. Lyla Glassing.  Presents to the emergency department with a chief complaint of right ankle pain after suffering a fall.  Patient reports that today at approximately 7 PM she woke up from taking a nap.  Patient reports that she got out of bed and suffered a fall.  Patient is unsure the exact mechanism of her fall.  Patient is unsure if she hit her head or had any loss of consciousness.  Patient reports that she was able to call her friend who was at the house and approximately 10 to 15 minutes after fall.  Patient reports that she has had continued pain to her right ankle after suffering a fall.   ? ?Denies any headache, visual disturbance, neck pain, back pain, numbness, weakness, saddle anesthesia, abdominal pain, nausea, vomiting, chest pain, shortness of breath. ? ?Patient's friend at bedside reports that patient had been staying with him for the last 4 months after her surgery.  Patient has been home for 5 days and he is on sure if she will be able to take care of herself when at home alone. ? ? ?Ankle Pain ?Associated symptoms: no back pain, no fever and no neck pain   ?Fall ?Pertinent negatives include no chest pain, no abdominal pain, no headaches and no shortness of breath.  ? ?  ? ?Home Medications ?Prior to Admission medications   ?Medication Sig Start Date End Date Taking? Authorizing Provider  ?amLODipine (NORVASC) 5 MG tablet Take 5 mg by mouth daily. 05/11/21   [provider]  ?Ascorbic Acid (VITAMIN C PO) Take 1 tablet by mouth daily.    [provider]  ?fenofibrate 54 MG tablet Take 54 mg by mouth daily. 11/13/17   [provider]  ?folic acid (FOLVITE) A999333 MCG tablet Take 400 mcg by mouth 3 (three) times a week. No specific days    [provider]  ?hydrochlorothiazide (HYDRODIURIL) 25 MG tablet Take 25 mg by mouth daily. 07/06/21   [provider]  ?HYDROcodone-acetaminophen (NORCO/VICODIN) 5-325 MG tablet Take 1-2 tablets by mouth every 6 (six) hours as needed for moderate pain. 07/09/21   Dorothyann Peng, PA-C  ?LORazepam (ATIVAN) 1 MG tablet Take 1 mg by mouth See admin instructions. 1mg  oral at bedtime ?1mg  oral daily as needed for anxiety 12/15/14   [provider]  ?losartan (COZAAR) 100 MG tablet Take 100 mg by mouth daily. 05/19/21   [provider]  ?ondansetron (ZOFRAN) 4 MG tablet Take 1 tablet (4 mg total) by mouth every 6 (six) hours as needed for nausea. 07/10/21   Pokhrel, Corrie Mckusick, MD  ?polyethylene glycol (MIRALAX / GLYCOLAX) 17 g packet Take 17 g by mouth daily as needed for mild constipation. 07/10/21   Pokhrel, Corrie Mckusick, MD  ?polyvinyl alcohol (LIQUIFILM TEARS) 1.4 % ophthalmic solution Place 1 drop into both eyes as needed for dry eyes.    [provider]  ?pravastatin (PRAVACHOL) 40 MG tablet Take 40 mg by mouth daily. 12/28/17   [provider]  ?sertraline (ZOLOFT) 50 MG tablet Take 50 mg by mouth daily. 07/06/21   [provider]  ?triamcinolone  cream (KENALOG) 0.1 % Apply 1 application topically 2 (two) times daily as needed for itching. 02/22/21   [provider]  ?VITAMIN D PO Take 1 capsule by mouth daily.    [provider]  ?   ? ?Allergies    ?Statins   ? ?Review of Systems   ?Review of Systems  ?Constitutional:  Negative for chills and fever.  ?Eyes:  Negative for visual disturbance.  ?Respiratory:  Negative for shortness of breath.   ?Cardiovascular:  Negative for chest pain.  ?Gastrointestinal:  Negative for abdominal pain, nausea and vomiting.  ?Musculoskeletal:  Positive for arthralgias. Negative for back pain and neck  pain.  ?Skin:  Negative for color change and rash.  ?Neurological:  Negative for dizziness, tremors, seizures, syncope, facial asymmetry, speech difficulty, weakness, light-headedness, numbness and headaches.  ?Psychiatric/Behavioral:  Negative for confusion.   ? ?Physical Exam ?Updated Vital Signs ?BP 133/72 (BP Location: Right Arm)   Pulse 88   Temp 98.2 ?F (36.8 ?C) (Oral)   Resp 17   Ht 5\' 4"  (1.626 m)   Wt 55.3 kg   SpO2 94%   BMI 20.94 kg/m?  ?Physical Exam ?Vitals and nursing note reviewed.  ?Constitutional:   ?   General: She is not in acute distress. ?   Appearance: She is not ill-appearing, toxic-appearing or diaphoretic.  ?HENT:  ?   Head: Normocephalic.  ?Eyes:  ?   General: No scleral icterus.    ?   Right eye: No discharge.     ?   Left eye: No discharge.  ?Cardiovascular:  ?   Rate and Rhythm: Normal rate.  ?Pulmonary:  ?   Effort: Pulmonary effort is normal.  ?Abdominal:  ?   General: Abdomen is flat. There is no distension. There are no signs of injury.  ?   Palpations: Abdomen is soft. There is no mass or pulsatile mass.  ?   Tenderness: There is no abdominal tenderness.  ?Musculoskeletal:  ?   Cervical back: No swelling, edema, deformity, erythema, signs of trauma, lacerations, rigidity, spasms, torticollis, tenderness, bony tenderness or crepitus. No pain with movement. Normal range of motion.  ?   Thoracic back: No swelling, edema, deformity, signs of trauma, lacerations, spasms, tenderness or bony tenderness.  ?   Lumbar back: No swelling, edema, deformity, signs of trauma, lacerations, spasms, tenderness or bony tenderness.  ?   Right hip: No deformity, tenderness, bony tenderness or crepitus.  ?   Left hip: No deformity, tenderness, bony tenderness or crepitus.  ?   Right upper leg: Normal.  ?   Left upper leg: Normal.  ?   Right knee: No swelling, deformity, effusion, erythema, ecchymosis, lacerations, bony tenderness or crepitus. No tenderness. Normal alignment.  ?   Left knee: No  swelling, deformity, effusion, erythema, ecchymosis, lacerations, bony tenderness or crepitus. No tenderness. Normal alignment.  ?   Right lower leg: Normal.  ?   Left lower leg: Normal.  ?   Right ankle: Swelling, deformity and ecchymosis present. No lacerations. Tenderness present over the lateral malleolus and medial malleolus. Decreased range of motion.  ?   Left ankle: No swelling, deformity, ecchymosis or lacerations. No tenderness.  ?   Right foot: Decreased range of motion. Normal capillary refill. Swelling and tenderness present. No deformity, laceration, bony tenderness or crepitus. Normal pulse.  ?   Left foot: Normal range of motion and normal capillary refill. No swelling, deformity, laceration, tenderness, bony tenderness or crepitus. Normal pulse.  ?  Skin: ?   General: Skin is warm and dry.  ?Neurological:  ?   General: No focal deficit present.  ?   Mental Status: She is alert.  ?Psychiatric:     ?   Behavior: Behavior is cooperative.  ? ? ?ED Results / Procedures / Treatments   ?Labs ?(all labs ordered are listed, but only abnormal results are displayed) ?Labs Reviewed  ?BASIC METABOLIC PANEL - Abnormal; Notable for the following components:  ?    Result Value  ? Potassium 3.2 (*)   ? Glucose, Bld 111 (*)   ? All other components within normal limits  ?CBC WITH DIFFERENTIAL/PLATELET  ?TROPONIN I (HIGH SENSITIVITY)  ?TROPONIN I (HIGH SENSITIVITY)  ? ? ?EKG ?None ? ?Radiology ?DG Ankle Complete Right ? ?Result Date: 10/27/2021 ?CLINICAL DATA:  Ankle pain. EXAM: RIGHT ANKLE - COMPLETE 3+ VIEW COMPARISON:  None. FINDINGS: There is an acute comminuted fracture of the distal fibular diaphysis above the level of the ankle mortise. There is apex anterior angulation. There is an acute vertically oriented fracture through the posterior malleolus, minimally displaced. There is an acute transverse comminuted fracture through the medial malleolus. There is 7 mm of medial and anterior subluxation of the distal  tibia in relation to the talar dome. There is soft tissue swelling surrounding the ankle. IMPRESSION: 1. Acute displaced trimalleolar ankle fracture. 2. Anteromedial displacement of the distal tibia in relation to the

## 2021-10-28 ENCOUNTER — Emergency Department (HOSPITAL_COMMUNITY): Payer: Medicare Other

## 2021-10-28 DIAGNOSIS — E876 Hypokalemia: Secondary | ICD-10-CM | POA: Diagnosis present

## 2021-10-28 DIAGNOSIS — F419 Anxiety disorder, unspecified: Secondary | ICD-10-CM

## 2021-10-28 DIAGNOSIS — F32A Depression, unspecified: Secondary | ICD-10-CM

## 2021-10-28 DIAGNOSIS — E785 Hyperlipidemia, unspecified: Secondary | ICD-10-CM | POA: Diagnosis present

## 2021-10-28 DIAGNOSIS — S82851A Displaced trimalleolar fracture of right lower leg, initial encounter for closed fracture: Secondary | ICD-10-CM | POA: Diagnosis not present

## 2021-10-28 DIAGNOSIS — I10 Essential (primary) hypertension: Secondary | ICD-10-CM | POA: Diagnosis present

## 2021-10-28 DIAGNOSIS — Z8051 Family history of malignant neoplasm of kidney: Secondary | ICD-10-CM | POA: Diagnosis not present

## 2021-10-28 DIAGNOSIS — Z800 Family history of malignant neoplasm of digestive organs: Secondary | ICD-10-CM | POA: Diagnosis not present

## 2021-10-28 DIAGNOSIS — S82891A Other fracture of right lower leg, initial encounter for closed fracture: Secondary | ICD-10-CM

## 2021-10-28 DIAGNOSIS — Z79899 Other long term (current) drug therapy: Secondary | ICD-10-CM | POA: Diagnosis not present

## 2021-10-28 DIAGNOSIS — S82899A Other fracture of unspecified lower leg, initial encounter for closed fracture: Secondary | ICD-10-CM | POA: Diagnosis present

## 2021-10-28 DIAGNOSIS — Z20822 Contact with and (suspected) exposure to covid-19: Secondary | ICD-10-CM | POA: Diagnosis present

## 2021-10-28 DIAGNOSIS — W06XXXA Fall from bed, initial encounter: Secondary | ICD-10-CM | POA: Diagnosis present

## 2021-10-28 DIAGNOSIS — M8580 Other specified disorders of bone density and structure, unspecified site: Secondary | ICD-10-CM | POA: Diagnosis present

## 2021-10-28 DIAGNOSIS — Z880 Allergy status to penicillin: Secondary | ICD-10-CM | POA: Diagnosis not present

## 2021-10-28 DIAGNOSIS — Z888 Allergy status to other drugs, medicaments and biological substances status: Secondary | ICD-10-CM | POA: Diagnosis not present

## 2021-10-28 DIAGNOSIS — Y92013 Bedroom of single-family (private) house as the place of occurrence of the external cause: Secondary | ICD-10-CM | POA: Diagnosis not present

## 2021-10-28 DIAGNOSIS — Z96641 Presence of right artificial hip joint: Secondary | ICD-10-CM | POA: Diagnosis present

## 2021-10-28 DIAGNOSIS — Z8249 Family history of ischemic heart disease and other diseases of the circulatory system: Secondary | ICD-10-CM | POA: Diagnosis not present

## 2021-10-28 DIAGNOSIS — Z823 Family history of stroke: Secondary | ICD-10-CM | POA: Diagnosis not present

## 2021-10-28 LAB — BASIC METABOLIC PANEL
Anion gap: 4 — ABNORMAL LOW (ref 5–15)
BUN: 16 mg/dL (ref 8–23)
CO2: 28 mmol/L (ref 22–32)
Calcium: 8.4 mg/dL — ABNORMAL LOW (ref 8.9–10.3)
Chloride: 108 mmol/L (ref 98–111)
Creatinine, Ser: 0.65 mg/dL (ref 0.44–1.00)
GFR, Estimated: >60 mL/min (ref >60)
Glucose, Bld: 96 mg/dL (ref 70–99)
Potassium: 3.8 mmol/L (ref 3.5–5.1)
Sodium: 140 mmol/L (ref 135–145)

## 2021-10-28 LAB — TROPONIN I (HIGH SENSITIVITY): Troponin I (High Sensitivity): 2 ng/L (ref <18)

## 2021-10-28 MED ORDER — SODIUM CHLORIDE 0.9 % IV BOLUS
500.0000 mL | Freq: Once | INTRAVENOUS | Status: AC
  Administered 2021-10-28: 500 mL via INTRAVENOUS

## 2021-10-28 MED ORDER — MORPHINE SULFATE (PF) 2 MG/ML IV SOLN: 1.0000 mg | INTRAVENOUS | Status: DC | PRN

## 2021-10-28 MED ORDER — LORAZEPAM 1 MG PO TABS: 1.0000 mg | ORAL_TABLET | Freq: Every evening | ORAL | Status: DC | PRN

## 2021-10-28 MED ORDER — ACETAMINOPHEN 325 MG PO TABS
650.0000 mg | ORAL_TABLET | Freq: Four times a day (QID) | ORAL | Status: DC | PRN
  Administered 2021-10-29: 650 mg via ORAL
  Filled 2021-10-28: qty 2

## 2021-10-28 NOTE — Care Plan (Signed)
Orthopaedic Surgery ?Plan of Care Note ? ?-consult to ortho for displaced right trimalleolar ankle fracture ?-reviewed imaging and CT, patient will benefit from operative fixation due to ankle instability ?-will plan for OR Fri/weekend/early next week pending room availability ?-OK for diet today, please keep NPO and hold VTE ppx from midnight tonight ?-please document preoperative clearance ?-full consult note to follow ? ?Netta Cedars, MD ?Orthopaedic Surgery ?[facility] ? ?

## 2021-10-28 NOTE — H&P (Signed)
, ?History and Physical  ? ? ?Patient: Jessica LefevreMary Spallone ZOX:[id number] DOB: 1945/11/21 ?DOA: 10/27/2021 ?DOS: the patient was seen and examined on 10/28/2021 ?PCP: Merri BrunetteSmith, Candace, MD  ?Patient coming from: Home ? ?Chief Complaint:  ?Chief Complaint  ?Patient presents with  ? Ankle Pain  ? Fall  ? ?HPI: Jessica LefevreMary Bickle is a 76 y.o. female with medical history significant of right femoral neck fracture, osteopenia, hypertension lipidemia and depression who presents with ankle pain following a fall. ? ?Patient lives alone and was attempting to move a small couch this evening when it fell on her right ankle causing her to fall.  She was unable to get up or bear weight.  Tried to crawl to her bedroom to rest but finally decided to call EMS due to increasing pain.  Patient denies history of frequent falls but was hospitalized in 07/2021 for right hip fracture following fall.  Denies any dizziness/lightheadedness, chest pain or palpitation. ? ?In the ED, she was afebrile and normotensive on room air.  She was found to have an acute displaced right trimalleolar ankle fracture with anterior medial displacement of the distal tibia in relation to the talus. ?CBC unremarkable.  BMP with mild hypokalemia of 3.2. ? ?CT of the right ankle was pending at the time of evaluation.  ED PA discussed with [facility] Dr.Aluisio who recommended placing patient in short leg splint and will evaluate in the morning.  Hospitalist called for pain management. ? ?Review of Systems: As mentioned in the history of present illness. All other systems reviewed and are negative. ?Past Medical History:  ?Diagnosis Date  ? Detached retina   ? Hyperlipemia   ? Hypertension   ? ?Past Surgical History:  ?Procedure Laterality Date  ? TOTAL HIP ARTHROPLASTY Right 07/08/2021  ? Procedure: TOTAL HIP ARTHROPLASTY ANTERIOR APPROACH;  Surgeon: Samson FredericSwinteck, Brian, MD;  Location: [facility];  Service: Orthopedics;  Laterality: Right;  ? ?Social History:  reports that she has never  smoked. She does not have any smokeless tobacco history on file. She reports current alcohol use. She reports that she does not use drugs. ? ?Allergies  ?Allergen Reactions  ? Statins   ?  Muscle aches with all Statins  ? ? ?Family History  ?Problem Relation Age of Onset  ? Pancreatic cancer Mother   ? CVA Father   ? Kidney cancer Brother   ? Heart attack Brother [id number]  ? Hyperlipidemia Unknown   ? Hypertension Unknown   ? ? ?Prior to Admission medications   ?Medication Sig Start Date End Date Taking? Authorizing Provider  ?amLODipine (NORVASC) 5 MG tablet Take 5 mg by mouth daily. 05/11/21   [provider]  ?Ascorbic Acid (VITAMIN C PO) Take 1 tablet by mouth daily.    [provider]  ?fenofibrate 54 MG tablet Take 54 mg by mouth daily. 11/13/17   [provider]  ?folic acid (FOLVITE) 400 MCG tablet Take 400 mcg by mouth 3 (three) times a week. No specific days    [provider]  ?hydrochlorothiazide (HYDRODIURIL) 25 MG tablet Take 25 mg by mouth daily. 07/06/21   [provider]  ?HYDROcodone-acetaminophen (NORCO/VICODIN) 5-325 MG tablet Take 1-2 tablets by mouth every 6 (six) hours as needed for moderate pain. 07/09/21   Darrick GrinderMcCauley, Larry B, PA-C  ?LORazepam (ATIVAN) 1 MG tablet Take 1 mg by mouth See admin instructions. 1mg  oral at bedtime ?1mg  oral daily as needed for anxiety 12/15/14   [provider]  ?losartan (COZAAR) 100 MG  tablet Take 100 mg by mouth daily. 05/19/21   [provider]  ?ondansetron (ZOFRAN) 4 MG tablet Take 1 tablet (4 mg total) by mouth every 6 (six) hours as needed for nausea. 07/10/21   Pokhrel, Rebekah Chesterfield, MD  ?polyethylene glycol (MIRALAX / GLYCOLAX) 17 g packet Take 17 g by mouth daily as needed for mild constipation. 07/10/21   Pokhrel, Rebekah Chesterfield, MD  ?polyvinyl alcohol (LIQUIFILM TEARS) 1.4 % ophthalmic solution Place 1 drop into both eyes as needed for dry eyes.    [provider]  ?pravastatin (PRAVACHOL) 40 MG tablet Take  40 mg by mouth daily. 12/28/17   [provider]  ?sertraline (ZOLOFT) 50 MG tablet Take 50 mg by mouth daily. 07/06/21   [provider]  ?triamcinolone cream (KENALOG) 0.1 % Apply 1 application topically 2 (two) times daily as needed for itching. 02/22/21   [provider]  ?VITAMIN D PO Take 1 capsule by mouth daily.    [provider]  ? ? ?Physical Exam: ?Vitals:  ? 10/27/21 2016 10/28/21 0100 10/28/21 0130 10/28/21 0200  ?BP: 133/72 131/73 123/63 127/68  ?Pulse: 88 79 78 85  ?Resp: 17 (!) 9 13 13   ?Temp: 98.2 ?F (36.8 ?C)     ?TempSrc: Oral     ?SpO2: 94% 100% 98% 99%  ?Weight:      ?Height:      ? ?Constitutional: NAD, calm, comfortable, drowsy appearing elderly female laying flat in bed.  Likely drowsy secondary to IV morphine. ?Eyes: PERRL, lids and conjunctivae normal ?ENMT: Mucous membranes are dry.   ?Neck: normal, suppl ?Respiratory: clear to auscultation bilaterally, no wheezing, no crackles. Normal respiratory effort. No accessory muscle use.  ?Cardiovascular: Regular rate and rhythm, no murmurs / rubs / gallops. No extremity edema. 2+ pedal pulses.   ?Abdomen: no tenderness,  Bowel sounds positive.  ?Musculoskeletal: no clubbing / cyanosis.  Right lower extremity wrapped in short splint with Ace bandage.  No right leg shortening.   ?Skin: no rashes, lesions, ulcers.  ?Neurologic: CN 2-12 grossly intact.  Strength 5/5 in all 4.  Drowsy with slow speech. ?Psychiatric: Normal judgment and insight. Alert and oriented x 3. Normal mood. ?Data Reviewed: ? ?See HPI ? ?Assessment and Plan: ?* Ankle fracture ?Right trimalleolar ankle fracture s/p mechanical fall ?-CT right ankle showed trimalleolar fracture with widening of the medial morsel and partial posterior tibial subluxation. Likely has unstable ankle.  Placed in short leg splint per [facility].  They will evaluate tomorrow to decided on sugery ?-keep NPO  ?-PRN low dose morphine for moderate to severe  pain ? ? ? ?Hypokalemia ?Mild hypokalemia with K of 3.2.  Repleted with oral K in ED. ? ?Anxiety ?depression ?- Resume home meds once med rec is complete ? ?Essential hypertension ?-Controlled.  Resume antihypertensives once med rec is complete ? ? ? ? ? Advance Care Planning:   Code Status: Full Code  ? ?Consults: [facility] ? ?Family Communication: No family at bedside ? ?Severity of Illness: ?The appropriate patient status for this patient is OBSERVATION. Observation status is judged to be reasonable and necessary in order to provide the required intensity of service to ensure the patient's safety. The patient's presenting symptoms, physical exam findings, and initial radiographic and laboratory data in the context of their medical condition is felt to place them at decreased risk for further clinical deterioration. Furthermore, it is anticipated that the patient will be medically stable for discharge from the hospital within 2 midnights  of admission.  ? ?Author: Anselm Jungling, DO ?10/28/2021 2:06 AM ? ?For on call review [email or web address].  ?

## 2021-10-28 NOTE — Assessment & Plan Note (Addendum)
Replaced. °

## 2021-10-28 NOTE — Consult Note (Addendum)
? ? ?Patient ID: ?Jessica Anthony ?MRN: [id number] ?DOB/AGE: September 15, 1945 76 y.o. ? ?Admit date: 10/27/2021 ? ?Admission Diagnoses:  ?Principal Problem: ?  Ankle fracture ?Active Problems: ?  Essential hypertension ?  Anxiety ?  Depression ?  Hypokalemia ?  Trimalleolar fracture of ankle, closed, right, initial encounter ? ? ?HPI: ?Ortho consulted for right closed trimalleolar ankle fracture dislocation. The patient was closed reduced in the ED and underwent CT which shows improved reduction. She was admitted for pain management and safety as she lives by herself at home. She recently had right THA for femoral neck fracture by Dr. Lyla Glassing in Dec 2022 and was recovering well from this until her most recent fall.  ? ?PMH notable for osteopenia, hypertension, dyslipidemia, and depression. ? ?Past Medical History: ?Past Medical History:  ?Diagnosis Date  ? Detached retina   ? Hyperlipemia   ? Hypertension   ? ? ?Surgical History: ?Past Surgical History:  ?Procedure Laterality Date  ? TOTAL HIP ARTHROPLASTY Right 07/08/2021  ? Procedure: TOTAL HIP ARTHROPLASTY ANTERIOR APPROACH;  Surgeon: Rod Can, MD;  Location: [facility];  Service: Orthopedics;  Laterality: Right;  ? ? ?Family History: ?Family History  ?Problem Relation Age of Onset  ? Pancreatic cancer Mother   ? CVA Father   ? Kidney cancer Brother   ? Heart attack Brother 28  ? Hyperlipidemia Unknown   ? Hypertension Unknown   ? ? ?Social History: ?Social History  ? ?Socioeconomic History  ? Marital status: Divorced  ?  Spouse name: Not on file  ? Number of children: Not on file  ? Years of education: Not on file  ? Highest education level: Not on file  ?Occupational History  ? Not on file  ?Tobacco Use  ? Smoking status: Never  ? Smokeless tobacco: Not on file  ?Substance and Sexual Activity  ? Alcohol use: Yes  ?  Alcohol/week: 0.0 standard drinks  ? Drug use: No  ? Sexual activity: Not on file  ?Other Topics Concern  ? Not on file  ?Social History Narrative  ? Not  on file  ? ?Social Determinants of Health  ? ?Financial Resource Strain: Not on file  ?Food Insecurity: Not on file  ?Transportation Needs: Not on file  ?Physical Activity: Not on file  ?Stress: Not on file  ?Social Connections: Not on file  ?Intimate Partner Violence: Not on file  ? ? ?Allergies: ?Amoxicillin and Statins ? ?Medications: ?I have reviewed the patient's current medications. ? ?Vital Signs: ?Patient Vitals for the past 24 hrs: ? BP Temp Temp src Pulse Resp SpO2  ?10/28/21 2139 (!) 141/79 98.3 ?F (36.8 ?C) Oral 90 17 97 %  ?10/28/21 1203 134/69 97.9 ?F (36.6 ?C) -- 90 18 97 %  ?10/28/21 0818 (!) 154/90 98.8 ?F (37.1 ?C) Oral 76 16 100 %  ?10/28/21 0642 133/76 -- -- 78 12 95 %  ?10/28/21 0503 126/76 -- -- 85 16 99 %  ?10/28/21 0400 121/82 -- -- 77 12 98 %  ?10/28/21 0245 115/70 -- -- 89 15 99 %  ?10/28/21 0200 127/68 -- -- 85 13 99 %  ?10/28/21 0130 123/63 -- -- 78 13 98 %  ?10/28/21 0100 131/73 -- -- 79 (!) 9 100 %  ? ? ?Radiology: ?DG Pelvis 1-2 Views ? ?Result Date: 10/27/2021 ?CLINICAL DATA:  Fall EXAM: PELVIS - 1-2 VIEW COMPARISON:  07/08/2021 FINDINGS: There is no evidence of pelvic fracture or diastasis. No pelvic bone lesions are seen. Normal appearance of right total  hip arthroplasty. IMPRESSION: Negative. Electronically Signed   By: Ulyses Jarred M.D.   On: 10/27/2021 23:09  ? ?DG Ankle Complete Right ? ?Result Date: 10/27/2021 ?CLINICAL DATA:  Ankle pain. EXAM: RIGHT ANKLE - COMPLETE 3+ VIEW COMPARISON:  None. FINDINGS: There is an acute comminuted fracture of the distal fibular diaphysis above the level of the ankle mortise. There is apex anterior angulation. There is an acute vertically oriented fracture through the posterior malleolus, minimally displaced. There is an acute transverse comminuted fracture through the medial malleolus. There is 7 mm of medial and anterior subluxation of the distal tibia in relation to the talar dome. There is soft tissue swelling surrounding the ankle.  IMPRESSION: 1. Acute displaced trimalleolar ankle fracture. 2. Anteromedial displacement of the distal tibia in relation to the talus. Electronically Signed   By: Ronney Asters M.D.   On: 10/27/2021 20:58  ? ?CT HEAD WO CONTRAST (5MM) ? ?Result Date: 10/27/2021 ?CLINICAL DATA:  Trip and fall. EXAM: CT HEAD WITHOUT CONTRAST CT CERVICAL SPINE WITHOUT CONTRAST TECHNIQUE: Multidetector CT imaging of the head and cervical spine was performed following the standard protocol without intravenous contrast. Multiplanar CT image reconstructions of the cervical spine were also generated. RADIATION DOSE REDUCTION: This exam was performed according to the departmental dose-optimization program which includes automated exposure control, adjustment of the mA and/or kV according to patient size and/or use of iterative reconstruction technique. COMPARISON:  None. FINDINGS: CT HEAD FINDINGS Brain: No evidence of acute infarction, hemorrhage, hydrocephalus, extra-axial collection or mass lesion/mass effect. Vascular: Atherosclerotic calcifications are present within the cavernous internal carotid arteries. Skull: Normal. Negative for fracture or focal lesion. Sinuses/Orbits: No acute finding. Other: None. CT CERVICAL SPINE FINDINGS Alignment: Normal. Skull base and vertebrae: No acute fracture. No primary bone lesion or focal pathologic process. Soft tissues and spinal canal: No prevertebral fluid or swelling. No visible canal hematoma. Disc levels: There is intervertebral disc space narrowing and endplate osteophyte formation at C5-C6 and C6-C7 compatible with degenerative change. There is moderate right-sided neural foraminal stenosis at C6-C7 secondary to uncovertebral spurring. There is also mild central canal stenosis at this level secondary to posterior disc osteophyte complex. Upper chest: Negative. Other: None. IMPRESSION: No acute intracranial process. No acute fracture or traumatic subluxation of the cervical spine.  Electronically Signed   By: Ronney Asters M.D.   On: 10/27/2021 22:43  ? ?CT Cervical Spine Wo Contrast ? ?Result Date: 10/27/2021 ?CLINICAL DATA:  Trip and fall. EXAM: CT HEAD WITHOUT CONTRAST CT CERVICAL SPINE WITHOUT CONTRAST TECHNIQUE: Multidetector CT imaging of the head and cervical spine was performed following the standard protocol without intravenous contrast. Multiplanar CT image reconstructions of the cervical spine were also generated. RADIATION DOSE REDUCTION: This exam was performed according to the departmental dose-optimization program which includes automated exposure control, adjustment of the mA and/or kV according to patient size and/or use of iterative reconstruction technique. COMPARISON:  None. FINDINGS: CT HEAD FINDINGS Brain: No evidence of acute infarction, hemorrhage, hydrocephalus, extra-axial collection or mass lesion/mass effect. Vascular: Atherosclerotic calcifications are present within the cavernous internal carotid arteries. Skull: Normal. Negative for fracture or focal lesion. Sinuses/Orbits: No acute finding. Other: None. CT CERVICAL SPINE FINDINGS Alignment: Normal. Skull base and vertebrae: No acute fracture. No primary bone lesion or focal pathologic process. Soft tissues and spinal canal: No prevertebral fluid or swelling. No visible canal hematoma. Disc levels: There is intervertebral disc space narrowing and endplate osteophyte formation at C5-C6 and C6-C7 compatible with degenerative change.  There is moderate right-sided neural foraminal stenosis at C6-C7 secondary to uncovertebral spurring. There is also mild central canal stenosis at this level secondary to posterior disc osteophyte complex. Upper chest: Negative. Other: None. IMPRESSION: No acute intracranial process. No acute fracture or traumatic subluxation of the cervical spine. Electronically Signed   By: Ronney Asters M.D.   On: 10/27/2021 22:43  ? ?CT Ankle Right Wo Contrast ? ?Result Date: 10/28/2021 ?CLINICAL  DATA:  Trimalleolar ankle fracture. EXAM: CT OF THE RIGHT ANKLE WITHOUT CONTRAST TECHNIQUE: Multidetector CT imaging of the right ankle was performed according to the standard protocol. Multiplanar CT image reconstructions

## 2021-10-28 NOTE — Assessment & Plan Note (Addendum)
Blood pressure stable. ?On losartan HCTZ and Norvasc at home. ?I do not think the patient requires 3 medication regimen for now. ?Will initiate Norvasc. ?

## 2021-10-28 NOTE — Plan of Care (Signed)
  Problem: Education: Goal: Knowledge of General Education information will improve Description: Including pain rating scale, medication(s)/side effects and non-pharmacologic comfort measures Outcome: Progressing   Problem: Nutrition: Goal: Adequate nutrition will be maintained Outcome: Progressing   Problem: Coping: Goal: Level of anxiety will decrease Outcome: Progressing   Problem: Elimination: Goal: Will not experience complications related to urinary retention Outcome: Progressing   

## 2021-10-28 NOTE — Plan of Care (Signed)
Problem: Education: ?Goal: Knowledge of General Education information will improve ?Description: Including pain rating scale, medication(s)/side effects and non-pharmacologic comfort measures ?Outcome: Progressing ?  ?Problem: Coping: ?Goal: Level of anxiety will decrease ?Outcome: Progressing ?  ?Problem: Safety: ?Goal: Ability to remain free from injury will improve ?Outcome: Progressing ?  ?Haydee Salter, RN ?10/28/21 ?2:11 PM ? ?

## 2021-10-28 NOTE — Progress Notes (Signed)
[facility] ?PLAN OF CARE NOTE ?Patient: Jessica Anthony TDD:[id number]   ?PCP: Merri Brunette, MD DOB: August 19, 1945   ?DOA: 10/27/2021   DOS: 10/28/2021   ? ?Patient was admitted by my colleague earlier on 10/28/2021. I have reviewed the H&P as well as assessment and plan and agree with the same. ?Important changes in the plan are listed below. ? ?Plan of care: ?Principal Problem: ?  Ankle fracture ?Active Problems: ?  Essential hypertension ?  Anxiety ?  Depression ?  Hypokalemia ?  Trimalleolar fracture of ankle, closed, right, initial encounter ?Orthopedics discussed with patient's son.  Patient is to be nonweightbearing for 2 to 3 months postoperatively.  Patient lives alone and does not have a lot of assistance locally. ?Son lives in [place]. ?Currently discussion is being made between son and TOC with regards to optimal place for surgery and postop recovery. ?No surgery scheduled today.  Most likely if the surgery will occur to occur on 3/25 on Saturday. ?Medically patient will be low to intermediate risk for surgery. ? ?Author: ?Lynden Oxford, MD ?[facility] ?10/28/2021 2:50 PM   ?If 7PM-7AM, please contact night-coverage at [email or web address] ?   ?

## 2021-10-28 NOTE — Progress Notes (Signed)
Chaplain acknowledges spiritual care consult for patient.  Chaplain attempted to see patient, but patient was unable to talk at that time.  Chaplains will attempt to see patient again tomorrow. ? ?[employer], B[facility] ?Pager, [phone] ?

## 2021-10-28 NOTE — Progress Notes (Signed)
Orthopedic Tech Progress Note ?Patient Details:  ?Jessica Anthony ?14-Jul-1946 ?[id number] ? ?Ortho Devices ?Type of Ortho Device: Stirrup splint, Post (short) splint ?Splint Material: Fiberglass ?Ortho Device/Splint Location: RLE ?Ortho Device/Splint Interventions: Ordered, Application, Adjustment ?  ?Post Interventions ?Patient Tolerated: Well ?Instructions Provided: Care of device ? ?Darleen Crocker ?10/28/2021, 12:49 AM ? ?

## 2021-10-28 NOTE — Assessment & Plan Note (Addendum)
Depression ?Mood appears to be stable.  Somewhat forgetful intermittently postoperatively. ?On Zoloft and Ativan.  Continue Zoloft.  Patient is on scheduled Ativan which is switch to as needed. ?

## 2021-10-29 DIAGNOSIS — S82891A Other fracture of right lower leg, initial encounter for closed fracture: Secondary | ICD-10-CM | POA: Diagnosis not present

## 2021-10-29 MED ORDER — POLYETHYLENE GLYCOL 3350 17 G PO PACK
17.0000 g | PACK | Freq: Every day | ORAL | Status: DC
  Administered 2021-10-29 – 2021-11-01 (×3): 17 g via ORAL
  Filled 2021-10-29 (×4): qty 1

## 2021-10-29 MED ORDER — SENNOSIDES-DOCUSATE SODIUM 8.6-50 MG PO TABS
1.0000 | ORAL_TABLET | Freq: Two times a day (BID) | ORAL | Status: DC
  Administered 2021-10-29 – 2021-11-02 (×6): 1 via ORAL
  Filled 2021-10-29 (×7): qty 1

## 2021-10-29 MED ORDER — ONDANSETRON HCL 4 MG/2ML IJ SOLN: 4.0000 mg | Freq: Four times a day (QID) | INTRAMUSCULAR | Status: DC | PRN

## 2021-10-29 MED ORDER — ACETAMINOPHEN 650 MG RE SUPP: 650.0000 mg | Freq: Four times a day (QID) | RECTAL | Status: DC | PRN

## 2021-10-29 MED ORDER — ONDANSETRON HCL 4 MG PO TABS: 4.0000 mg | ORAL_TABLET | Freq: Four times a day (QID) | ORAL | Status: DC | PRN

## 2021-10-29 MED ORDER — METHOCARBAMOL 1000 MG/10ML IJ SOLN
500.0000 mg | Freq: Four times a day (QID) | INTRAVENOUS | Status: DC | PRN
  Filled 2021-10-29: qty 5

## 2021-10-29 MED ORDER — BISACODYL 10 MG RE SUPP: 10.0000 mg | Freq: Every day | RECTAL | Status: DC | PRN

## 2021-10-29 MED ORDER — OXYCODONE HCL 5 MG PO TABS
5.0000 mg | ORAL_TABLET | ORAL | Status: DC | PRN
  Administered 2021-10-30 – 2021-11-01 (×3): 5 mg via ORAL
  Filled 2021-10-29 (×5): qty 1

## 2021-10-29 MED ORDER — FLEET ENEMA 7-19 GM/118ML RE ENEM: 1.0000 | ENEMA | Freq: Once | RECTAL | Status: DC | PRN

## 2021-10-29 MED ORDER — ACETAMINOPHEN 325 MG PO TABS
650.0000 mg | ORAL_TABLET | Freq: Four times a day (QID) | ORAL | Status: DC | PRN
  Administered 2021-10-31 (×3): 650 mg via ORAL
  Filled 2021-10-29 (×3): qty 2

## 2021-10-29 NOTE — Plan of Care (Signed)
  Problem: Education: Goal: Knowledge of General Education information will improve Description: Including pain rating scale, medication(s)/side effects and non-pharmacologic comfort measures Outcome: Progressing   Problem: Activity: Goal: Risk for activity intolerance will decrease Outcome: Progressing   Problem: Pain Managment: Goal: General experience of comfort will improve Outcome: Progressing   

## 2021-10-29 NOTE — Anesthesia Preprocedure Evaluation (Addendum)
Anesthesia Evaluation  ?Patient identified by MRN, date of birth, ID band ?Patient awake ? ? ? ?Reviewed: ?Allergy & Precautions, NPO status , Patient's Chart, lab work & pertinent test results ? ?History of Anesthesia Complications ?Negative for: history of anesthetic complications ? ?Airway ?Mallampati: II ? ?TM Distance: >3 FB ?Neck ROM: Full ? ? ? Dental ? ?(+) Dental Advisory Given, Teeth Intact ?  ?Pulmonary ?neg pulmonary ROS,  ?  ?Pulmonary exam normal ?breath sounds clear to auscultation ? ? ? ? ? ? Cardiovascular ?hypertension, Pt. on medications ?Normal cardiovascular exam ?Rhythm:Regular Rate:Normal ? ? ?  ?Neuro/Psych ?PSYCHIATRIC DISORDERS Anxiety Depression negative neurological ROS ?   ? GI/Hepatic ?negative GI ROS, Neg liver ROS,   ?Endo/Other  ?negative endocrine ROS ? Renal/GU ?negative Renal ROS  ? ?  ?Musculoskeletal ?negative musculoskeletal ROS ?(+)  ? Abdominal ?  ?Peds ? Hematology ?negative hematology ROS ?(+)   ?Anesthesia Other Findings ? ? Reproductive/Obstetrics ? ?  ? ? ? ? ? ? ? ? ? ? ? ? ? ?  ?  ? ? ? ? ? ?Anesthesia Physical ? ?Anesthesia Plan ? ?ASA: 2 ? ?Anesthesia Plan: General  ? ?Post-op Pain Management: Tylenol PO (pre-op)*, Celebrex PO (pre-op)* and Regional block*  ? ?Induction: Intravenous ? ?PONV Risk Score and Plan: 4 or greater and Ondansetron, Dexamethasone and Treatment may vary due to age or medical condition ? ?Airway Management Planned: Oral ETT ? ?Additional Equipment:  ? ?Intra-op Plan:  ? ?Post-operative Plan: Extubation in OR ? ?Informed Consent: I have reviewed the patients History and Physical, chart, labs and discussed the procedure including the risks, benefits and alternatives for the proposed anesthesia with the patient or authorized representative who has indicated his/her understanding and acceptance.  ? ? ? ?Dental advisory given ? ?Plan Discussed with: CRNA ? ?Anesthesia Plan Comments: (Patient refused to sign consent  until she spoke to surgeon. This occurred ~0735.)  ? ? ? ?Anesthesia Quick Evaluation ? ?

## 2021-10-29 NOTE — Progress Notes (Signed)
[facility] ?PROGRESS NOTE ? ?Patient: Jessica Anthony PRX:[id number]   ?PCP: Merri Brunette, MD DOB: [birth year]   ?DOA: 10/27/2021   DOS: 10/29/2021   ? ?Subjective: Denies any acute complaint.  No nausea no vomiting.  Pain well controlled.  No fever no chills.  Eating okay.  No diarrhea no constipation. ? ?Objective:  ?Vitals:  ? 10/29/21 0528 10/29/21 1352  ?BP: 130/88 137/86  ?Pulse: 80 93  ?Resp: 17 16  ?Temp: 98 ?F (36.7 ?C) 98.2 ?F (36.8 ?C)  ?SpO2: 98% 97%  ?  ?S1-S2 present. ?Bowel sound present. ?Able to move toes and sensation intact. ?Clear". ? ?Assessment and plan: ?Right trimalleolar ankle fracture after mechanical fall. ?Patient is low to moderate risk for surgery. ?Recently had hip fracture surgery without any complication. ?At present do not require any further work-up. ? ?Essential hypertension. ?Blood pressure is stable without any medication. ?At present holding home Norvasc, HCTZ and losartan. ? ?HLD. ?Patient is on statin.  Currently on hold. ? ?Mood disorder. ?Currently no evidence of acute abnormality. ?Remains stable on Zoloft and Ativan as needed. ? ?Author: ?Lynden Oxford, MD ?[facility] ?10/29/2021 5:57 PM   ?If 7PM-7AM, please contact night-coverage at [email or web address]  ?

## 2021-10-29 NOTE — Progress Notes (Signed)
Subjective: ?  Procedure(s) (LRB): ?RIGHT TRIMALLEOLAR OPEN REDUCTION INTERNAL FIXATION (ORIF) ANKLE FRACTURE, (Right) ?POSSIBLE EXTERNAL FIXATION ANKLE (Right) ? ?Patient reports pain as mild. She reports she has reviewed everything with son & partner - she wishes to pursue surgery tomorrow here at [place]. ? ?Objective:  ? ?VITALS:  Temp:  [97.9 ?F (36.6 ?C)-98.3 ?F (36.8 ?C)] 98.1 ?F (36.7 ?C) (03/24 2036) ?Pulse Rate:  [80-93] 83 (03/24 2036) ?Resp:  [16-18] 18 (03/24 2036) ?BP: (130-159)/(79-88) 159/87 (03/24 2036) ?SpO2:  [97 %-100 %] 97 % (03/24 2036) ? ?Gen: AAOx3, NAD ?  ?Right lower extremity: ?Short leg splint in place ?Wiggles toes ?SILT over toes ?CR<2s ? ? ?LABS ?Recent Labs  ?  10/27/21 ?2150  ?HGB 13.5  ?WBC 10.5  ?PLT 251  ? ?Recent Labs  ?  10/27/21 ?2150 10/28/21 ?0629  ?NA 139 140  ?K 3.2* 3.8  ?CL 105 108  ?CO2 28 28  ?BUN 20 16  ?CREATININE 0.80 0.65  ?GLUCOSE 111* 96  ? ?No results for input(s): LABPT, INR in the last 72 hours. ? ? ?Assessment/Plan: ?Right trimalleolar ankle fracture - DOI 10/27/21 ?  ?-reviewed history, exam and imaging at length with patient and her son by phone at patient request ?-she will benefit from right ankle ORIF with possible syndesmosis/deltoid repair and possible external fixation with delayed definitive fixation ?-plan for OR on Sat morning 10/30/21. Pls keep NPO from MN and hold VTE ppx ?-pain mmt and VTE ppx per primary team ?-strict NWB RLE, max elevation (3 pillow minimum) ? ?Netta Cedars ?10/29/2021, 9:00 PM ? ?

## 2021-10-29 NOTE — TOC Initial Note (Signed)
Transition of Care (TOC) - Initial/Assessment Note  ? ? ?Patient Details  ?Name: Jessica Anthony ?MRN: [id number] ?Date of Birth: [birth year] ? ?Transition of Care (TOC) CM/SW Contact:    ?Ayleah Hofmeister, LCSW ?Phone Number: ?10/29/2021, 3:51 PM ? ?Clinical Narrative:                 ?Met with pt and friend, Jenny Reichmann, today and spoke briefly with son yesterday.  Introducing self/ TOC role with dc planning.  Pt very pleasant but admits much frustration with her fall/ ankle fx and overall situation.  Pt notes that she had prior THA in Nov 2022 and had been staying with her friend (more accessible home) for 4 months, home x one week when the fall occurred.  Prior to THA, pt was living alone and independent overall.  We discussed anticipated surgery likely to be done over this weekend and possibility that she may need SNF rehab following.  Pt begrudgingly agrees with likely scenario but states she feels "defeated".  Jadene Pierini, at bedside and very supportive and notes he and pt have been close friends for several years and he has a good relationship with pt's son.  He notes pt was "doing pretty good" following her THA but concerned that her care needs may be greater after this surgery.  He and pt are agreed that she may need to consider SNF rehab.  TOC will follow along and assist with recommended/ chosen dc plans. ? ?Expected Discharge Plan: [place] (vs. HH) ?Barriers to Discharge: Continued Medical Work up ? ? ?Patient Goals and CMS Choice ?Patient states their goals for this hospitalization and ongoing recovery are:: to be home ?  ?  ? ?Expected Discharge Plan and Services ?Expected Discharge Plan: [place] (vs. HH) ?In-house Referral: Clinical Social Work ?  ?  ?Living arrangements for the past 2 months: [place] ?                ?  ?  ?  ?  ?  ?  ?  ?  ?  ?  ? ?Prior Living Arrangements/Services ?Living arrangements for the past 2 months: [place] ?Lives with::  Friends ?Patient language and need for interpreter reviewed:: Yes ?Do you feel safe going back to the place where you live?: Yes      ?Need for Family Participation in Patient Care: Yes (Comment) ?Care giver support system in place?: Yes (comment) ?  ?Criminal Activity/Legal Involvement Pertinent to Current Situation/Hospitalization: No - Comment as needed ? ?Activities of Daily Living ?Home Assistive Devices/Equipment: Bedside commode/3-in-1, Eyeglasses, Walker (specify type) (rolling walker) ?ADL Screening (condition at time of admission) ?Patient's cognitive ability adequate to safely complete daily activities?: Yes ?Is the patient deaf or have difficulty hearing?: No ?Does the patient have difficulty seeing, even when wearing glasses/contacts?: No ?Does the patient have difficulty concentrating, remembering, or making decisions?: No ?Patient able to express need for assistance with ADLs?: Yes ?Does the patient have difficulty dressing or bathing?: No ?Independently performs ADLs?: Yes (appropriate for developmental age) ?Does the patient have difficulty walking or climbing stairs?: No ?Weakness of Legs: None ?Weakness of Arms/Hands: None ? ?Permission Sought/Granted ?Permission sought to share information with : Family Supports ?Permission granted to share information with : Yes, Verbal Permission Granted ? Share Information with NAME: Vernona Rieger ?   ? Permission granted to share info w Relationship: son ? Permission granted to share info w Contact Information: [phone] ? ?Emotional Assessment ?Appearance::  Appears stated age ?Attitude/Demeanor/Rapport: Engaged, Gracious ?Affect (typically observed): Frustrated ?Orientation: : Oriented to Self, Oriented to Place, Oriented to  Time, Oriented to Situation ?Alcohol / Substance Use: Not Applicable ?Psych Involvement: No (comment) ? ?Admission diagnosis:  Ankle fracture [S82.899A] ?Closed trimalleolar fracture of right ankle, initial encounter [S82.851A] ?Fall,  initial encounter [W19.XXXA] ?Trimalleolar fracture of ankle, closed, right, initial encounter [S82.851A] ?Patient Active Problem List  ? Diagnosis Date Noted  ? Ankle fracture 10/28/2021  ? Hypokalemia 10/28/2021  ? Trimalleolar fracture of ankle, closed, right, initial encounter 10/28/2021  ? Femoral neck fracture ([place]) 07/07/2021  ? Essential hypertension 07/07/2021  ? Anxiety 07/07/2021  ? Depression 07/07/2021  ? Hyperlipidemia 12/19/2014  ? ?PCP:  Carol Ada, MD ?Pharmacy:   ?[id number] [place] [place] [facility] [place] ?[address] ?[place] [address] ?Phone: [phone] Fax: [phone] ? ? ? ? ?Social Determinants of Health (SDOH) Interventions ?  ? ?Readmission Risk Interventions ? ?  10/29/2021  ?  3:48 PM  ?Readmission Risk Prevention Plan  ?Post Dischage Appt Complete  ?Medication Screening Complete  ?Transportation Screening Complete  ? ? ? ?

## 2021-10-29 NOTE — Progress Notes (Signed)
Chaplain received a consult that patient was interested in creating an advance directive. Chaplain provided paperwork and education.  Jessica Anthony was appreciative and plans to look over paperwork and notarize after d/c. ? ?[employer], Bcc ?Pager, [phone] ?5:14 PM ? ?

## 2021-10-29 NOTE — Plan of Care (Signed)
  Problem: Nutrition: Goal: Adequate nutrition will be maintained Outcome: Progressing   Problem: Coping: Goal: Level of anxiety will decrease Outcome: Progressing   Problem: Pain Managment: Goal: General experience of comfort will improve Outcome: Progressing   

## 2021-10-30 ENCOUNTER — Encounter (HOSPITAL_COMMUNITY)
Admission: EM | Disposition: A | Discharge status: Skilled Nursing Facility | Payer: Self-pay | Source: Home / Self Care | Attending: Internal Medicine

## 2021-10-30 ENCOUNTER — Inpatient Hospital Stay (HOSPITAL_COMMUNITY): Payer: Medicare Other | Admitting: Anesthesiology

## 2021-10-30 ENCOUNTER — Inpatient Hospital Stay (HOSPITAL_COMMUNITY): Payer: Medicare Other

## 2021-10-30 ENCOUNTER — Other Ambulatory Visit: Payer: Self-pay

## 2021-10-30 DIAGNOSIS — S82851A Displaced trimalleolar fracture of right lower leg, initial encounter for closed fracture: Secondary | ICD-10-CM

## 2021-10-30 HISTORY — PX: ORIF ANKLE FRACTURE: SHX5408

## 2021-10-30 SURGERY — OPEN REDUCTION INTERNAL FIXATION (ORIF) ANKLE FRACTURE
Anesthesia: General | Site: Ankle | Laterality: Right

## 2021-10-30 MED ORDER — OXYCODONE HCL 5 MG PO TABS: 5.0000 mg | ORAL_TABLET | Freq: Four times a day (QID) | ORAL | 0 refills | Status: AC | PRN

## 2021-10-30 MED ORDER — FENTANYL CITRATE (PF) 250 MCG/5ML IJ SOLN
INTRAMUSCULAR | Status: AC
  Filled 2021-10-30: qty 5

## 2021-10-30 MED ORDER — STERILE WATER FOR IRRIGATION IR SOLN
Status: DC | PRN
  Administered 2021-10-30: 1000 mL

## 2021-10-30 MED ORDER — DROPERIDOL 2.5 MG/ML IJ SOLN: 0.6250 mg | Freq: Once | INTRAMUSCULAR | Status: DC | PRN

## 2021-10-30 MED ORDER — DEXAMETHASONE SODIUM PHOSPHATE 4 MG/ML IJ SOLN
INTRAMUSCULAR | Status: DC | PRN
  Administered 2021-10-30: 3 mg via PERINEURAL
  Administered 2021-10-30: 2 mg via PERINEURAL

## 2021-10-30 MED ORDER — PROPOFOL 10 MG/ML IV BOLUS
INTRAVENOUS | Status: AC
  Filled 2021-10-30: qty 20

## 2021-10-30 MED ORDER — LACTATED RINGERS IV SOLN: INTRAVENOUS | Status: DC | PRN

## 2021-10-30 MED ORDER — CELECOXIB 200 MG PO CAPS
ORAL_CAPSULE | ORAL | Status: AC
  Filled 2021-10-30: qty 1

## 2021-10-30 MED ORDER — PHENYLEPHRINE 40 MCG/ML (10ML) SYRINGE FOR IV PUSH (FOR BLOOD PRESSURE SUPPORT)
PREFILLED_SYRINGE | INTRAVENOUS | Status: AC
  Filled 2021-10-30: qty 10

## 2021-10-30 MED ORDER — LIDOCAINE HCL (CARDIAC) PF 100 MG/5ML IV SOSY
PREFILLED_SYRINGE | INTRAVENOUS | Status: DC | PRN
  Administered 2021-10-30: 50 mg via INTRAVENOUS

## 2021-10-30 MED ORDER — CEFAZOLIN SODIUM-DEXTROSE 2-4 GM/100ML-% IV SOLN
INTRAVENOUS | Status: AC
  Filled 2021-10-30: qty 100

## 2021-10-30 MED ORDER — CLONIDINE HCL (ANALGESIA) 100 MCG/ML EP SOLN
EPIDURAL | Status: DC | PRN
  Administered 2021-10-30: 50 ug
  Administered 2021-10-30: 30 ug

## 2021-10-30 MED ORDER — ONDANSETRON HCL 4 MG/2ML IJ SOLN
INTRAMUSCULAR | Status: AC
Start: 2021-10-30 — End: ?
  Filled 2021-10-30: qty 2

## 2021-10-30 MED ORDER — SODIUM CHLORIDE 0.9 % IR SOLN
Status: DC | PRN
  Administered 2021-10-30: 1000 mL

## 2021-10-30 MED ORDER — PHENYLEPHRINE 40 MCG/ML (10ML) SYRINGE FOR IV PUSH (FOR BLOOD PRESSURE SUPPORT)
PREFILLED_SYRINGE | INTRAVENOUS | Status: DC | PRN
  Administered 2021-10-30 (×3): 80 ug via INTRAVENOUS

## 2021-10-30 MED ORDER — FENTANYL CITRATE (PF) 250 MCG/5ML IJ SOLN
INTRAMUSCULAR | Status: DC | PRN
  Administered 2021-10-30: 50 ug via INTRAVENOUS
  Administered 2021-10-30: 25 ug via INTRAVENOUS
  Administered 2021-10-30: 50 ug via INTRAVENOUS
  Administered 2021-10-30: 25 ug via INTRAVENOUS
  Administered 2021-10-30: 50 ug via INTRAVENOUS

## 2021-10-30 MED ORDER — VANCOMYCIN HCL 1000 MG IV SOLR
INTRAVENOUS | Status: AC
  Filled 2021-10-30: qty 20

## 2021-10-30 MED ORDER — CELECOXIB 200 MG PO CAPS
200.0000 mg | ORAL_CAPSULE | Freq: Once | ORAL | Status: AC
  Administered 2021-10-30: 200 mg via ORAL

## 2021-10-30 MED ORDER — PROPOFOL 10 MG/ML IV BOLUS
INTRAVENOUS | Status: DC | PRN
  Administered 2021-10-30: 80 mg via INTRAVENOUS

## 2021-10-30 MED ORDER — DEXAMETHASONE SODIUM PHOSPHATE 10 MG/ML IJ SOLN
INTRAMUSCULAR | Status: AC
  Filled 2021-10-30: qty 1

## 2021-10-30 MED ORDER — ONDANSETRON HCL 4 MG/2ML IJ SOLN
INTRAMUSCULAR | Status: DC | PRN
  Administered 2021-10-30: 4 mg via INTRAVENOUS

## 2021-10-30 MED ORDER — ROPIVACAINE HCL 5 MG/ML IJ SOLN
INTRAMUSCULAR | Status: DC | PRN
  Administered 2021-10-30: 15 mL via PERINEURAL
  Administered 2021-10-30: 25 mL via PERINEURAL

## 2021-10-30 MED ORDER — ROCURONIUM BROMIDE 10 MG/ML (PF) SYRINGE
PREFILLED_SYRINGE | INTRAVENOUS | Status: DC | PRN
  Administered 2021-10-30: 50 mg via INTRAVENOUS

## 2021-10-30 MED ORDER — PHENYLEPHRINE HCL (PRESSORS) 10 MG/ML IV SOLN
INTRAVENOUS | Status: AC
  Filled 2021-10-30: qty 1

## 2021-10-30 MED ORDER — ENOXAPARIN SODIUM 40 MG/0.4ML IJ SOSY
40.0000 mg | PREFILLED_SYRINGE | INTRAMUSCULAR | Status: DC
  Administered 2021-10-30 – 2021-11-01 (×3): 40 mg via SUBCUTANEOUS
  Filled 2021-10-30 (×3): qty 0.4

## 2021-10-30 MED ORDER — CEFAZOLIN SODIUM-DEXTROSE 1-4 GM/50ML-% IV SOLN
1.0000 g | Freq: Three times a day (TID) | INTRAVENOUS | Status: AC
  Administered 2021-10-30 – 2021-10-31 (×2): 1 g via INTRAVENOUS
  Filled 2021-10-30 (×2): qty 50

## 2021-10-30 MED ORDER — LIDOCAINE HCL (PF) 2 % IJ SOLN
INTRAMUSCULAR | Status: AC
  Filled 2021-10-30: qty 5

## 2021-10-30 MED ORDER — SUGAMMADEX SODIUM 200 MG/2ML IV SOLN
INTRAVENOUS | Status: DC | PRN
  Administered 2021-10-30: 120 mg via INTRAVENOUS

## 2021-10-30 MED ORDER — HYDROMORPHONE HCL 1 MG/ML IJ SOLN: 0.2500 mg | INTRAMUSCULAR | Status: DC | PRN

## 2021-10-30 MED ORDER — DEXAMETHASONE SODIUM PHOSPHATE 10 MG/ML IJ SOLN
INTRAMUSCULAR | Status: DC | PRN
  Administered 2021-10-30: 5 mg via INTRAVENOUS

## 2021-10-30 MED ORDER — EPHEDRINE 5 MG/ML INJ
INTRAVENOUS | Status: AC
  Filled 2021-10-30: qty 5

## 2021-10-30 MED ORDER — EPHEDRINE SULFATE-NACL 50-0.9 MG/10ML-% IV SOSY
PREFILLED_SYRINGE | INTRAVENOUS | Status: DC | PRN
  Administered 2021-10-30: 5 mg via INTRAVENOUS

## 2021-10-30 MED ORDER — PHENYLEPHRINE HCL-NACL 20-0.9 MG/250ML-% IV SOLN
INTRAVENOUS | Status: DC | PRN
  Administered 2021-10-30: 30 ug/min via INTRAVENOUS

## 2021-10-30 MED ORDER — VANCOMYCIN HCL 1000 MG IV SOLR
INTRAVENOUS | Status: DC | PRN
Start: 2021-10-30 — End: 2021-10-30
  Administered 2021-10-30: 1000 mg

## 2021-10-30 MED ORDER — CEFAZOLIN SODIUM-DEXTROSE 2-4 GM/100ML-% IV SOLN
2.0000 g | INTRAVENOUS | Status: AC
  Administered 2021-10-30: 2 g via INTRAVENOUS

## 2021-10-30 MED ORDER — ACETAMINOPHEN 500 MG PO TABS
1000.0000 mg | ORAL_TABLET | Freq: Once | ORAL | Status: AC
  Administered 2021-10-30: 1000 mg via ORAL

## 2021-10-30 MED ORDER — ACETAMINOPHEN 500 MG PO TABS
ORAL_TABLET | ORAL | Status: AC
  Filled 2021-10-30: qty 2

## 2021-10-30 SURGICAL SUPPLY — 61 items
APL PRP STRL LF DISP 70% ISPRP (MISCELLANEOUS) ×2
BAG COUNTER SPONGE SURGICOUNT (BAG) IMPLANT
BAG SPEC THK2 15X12 ZIP CLS (MISCELLANEOUS) ×2
BAG SPNG CNTER NS LX DISP (BAG)
BAG ZIPLOCK 12X15 (MISCELLANEOUS) ×3 IMPLANT
BIT DRILL 2.5X2.75 QC CALB (BIT) ×2 IMPLANT
BIT DRILL 2.9 CANN QC NONSTRL (BIT) ×2 IMPLANT
BNDG ELASTIC 4X5.8 VLCR STR LF (GAUZE/BANDAGES/DRESSINGS) ×3 IMPLANT
BNDG ELASTIC 6X5.8 VLCR STR LF (GAUZE/BANDAGES/DRESSINGS) ×3 IMPLANT
BNDG GAUZE ELAST 4 BULKY (GAUZE/BANDAGES/DRESSINGS) ×3 IMPLANT
CHLORAPREP W/TINT 26 (MISCELLANEOUS) ×3 IMPLANT
COVER SURGICAL LIGHT HANDLE (MISCELLANEOUS) ×3 IMPLANT
CUFF TOURN SGL QUICK 24 (TOURNIQUET CUFF) ×3
CUFF TOURN SGL QUICK 34 (TOURNIQUET CUFF) ×3
CUFF TRNQT CYL 24X4X16.5-23 (TOURNIQUET CUFF) ×1 IMPLANT
CUFF TRNQT CYL 34X4.125X (TOURNIQUET CUFF) ×2 IMPLANT
DRAPE C-ARM 42X120 X-RAY (DRAPES) IMPLANT
DRAPE EXTREMITY T 121X128X90 (DISPOSABLE) ×3 IMPLANT
DRAPE OEC MINIVIEW 54X84 (DRAPES) ×3 IMPLANT
DRAPE U-SHAPE 47X51 STRL (DRAPES) ×3 IMPLANT
DRSG PAD ABDOMINAL 8X10 ST (GAUZE/BANDAGES/DRESSINGS) ×15 IMPLANT
ELECT REM PT RETURN 15FT ADLT (MISCELLANEOUS) ×3 IMPLANT
FIXATION ZIPTIGHT ANKLE SNDSMS (Ankle) ×2 IMPLANT
GAUZE SPONGE 4X4 12PLY STRL (GAUZE/BANDAGES/DRESSINGS) ×6 IMPLANT
GAUZE XEROFORM 5X9 LF (GAUZE/BANDAGES/DRESSINGS) ×3 IMPLANT
GLOVE SURG MICRO LTX SZ7.5 (GLOVE) ×3 IMPLANT
GLOVE SURG UNDER LTX SZ7.5 (GLOVE) ×3 IMPLANT
GOWN STRL REUS W/ TWL LRG LVL3 (GOWN DISPOSABLE) ×2 IMPLANT
GOWN STRL REUS W/TWL LRG LVL3 (GOWN DISPOSABLE) ×3
K-WIRE ACE 1.6X6 (WIRE) ×12
KIT BASIN OR (CUSTOM PROCEDURE TRAY) ×3 IMPLANT
KIT TURNOVER KIT A (KITS) IMPLANT
KWIRE ACE 1.6X6 (WIRE) ×4 IMPLANT
NS IRRIG 1000ML POUR BTL (IV SOLUTION) ×3 IMPLANT
PACK TOTAL JOINT (CUSTOM PROCEDURE TRAY) ×3 IMPLANT
PAD CAST 4YDX4 CTTN HI CHSV (CAST SUPPLIES) ×10 IMPLANT
PADDING CAST COTTON 4X4 STRL (CAST SUPPLIES) ×18
PADDING CAST COTTON 6X4 STRL (CAST SUPPLIES) ×3 IMPLANT
PLATE ACE 100DEG 3HOLE (Plate) ×2 IMPLANT
PLATE LOCK 6H 139 RT DIST FIB (Plate) ×2 IMPLANT
PROTECTOR NERVE ULNAR (MISCELLANEOUS) ×3 IMPLANT
SCREW LOCK CORT STAR 3.5X12 (Screw) ×10 IMPLANT
SCREW LOCK CORT STAR 3.5X14 (Screw) ×6 IMPLANT
SCREW NLOCK CANC HEX 4X22 (Screw) ×2 IMPLANT
SCREW NLOCK CANC HEX 4X26 (Screw) ×2 IMPLANT
SCREW NLOCK CANC HEX 4X28 (Screw) ×3 IMPLANT
SCREW NLOCK CANC HEX 4X35 (Screw) ×2 IMPLANT
SCREW NLOCK FT 28X4XSLD NS FIB (Screw) ×1 IMPLANT
SCREW NON LOCKING LP 3.5 14MM (Screw) ×4 IMPLANT
SCREW NON LOCKING LP 3.5 16MM (Screw) ×2 IMPLANT
SPONGE T-LAP 18X18 ~~LOC~~+RFID (SPONGE) ×3 IMPLANT
STOCKINETTE 4X48 STRL (DRAPES) ×3 IMPLANT
SUCTION FRAZIER HANDLE 10FR (MISCELLANEOUS) ×3
SUCTION TUBE FRAZIER 10FR DISP (MISCELLANEOUS) ×2 IMPLANT
SUT ETHILON 3 0 PS 1 (SUTURE) ×6 IMPLANT
SUT VIC AB 2-0 SH 27 (SUTURE) ×3
SUT VIC AB 2-0 SH 27XBRD (SUTURE) ×2 IMPLANT
SUT VIC AB 3-0 SH 27 (SUTURE) ×6
SUT VIC AB 3-0 SH 27X BRD (SUTURE) ×4 IMPLANT
WATER STERILE IRR 1000ML POUR (IV SOLUTION) ×3 IMPLANT
ZIPTIGHT ANKLE SYNODESMOSS FIX (Ankle) ×6 IMPLANT

## 2021-10-30 NOTE — Op Note (Addendum)
10/30/2021 ? ?11:21 AM ? ? ?PATIENT: Jessica Anthony  76 y.o. female ? ?MRN: [id number] ? ? ?PRE-OPERATIVE DIAGNOSIS:   ?Right trimalleolar ankle fracture dislocation ? ? ?POST-OPERATIVE DIAGNOSIS:   ?Same ? ? ?PROCEDURE: ?Right trimalleolar ankle fracture open reduction internal fixation with fixation of posterior lip ?Right ankle syndesmosis open reduction internal fixation ? ? ?SURGEON:  Netta Cedars, MD ? ? ?ASSISTANT: None ? ? ?ANESTHESIA: General, regional ? ? ?EBL: Minimal ? ? ?TOURNIQUET:   ? ?Total Tourniquet Time Documented: ?Thigh (Right) - 131 minutes ?Total: Thigh (Right) - 131 minutes ? ? ? ?COMPLICATIONS: None apparent ? ? ?DISPOSITION: Extubated, awake and stable to recovery. ? ? ?INDICATION FOR PROCEDURE: ?The patient presented with right displaced trimalleolar ankle fracture that was closed reduced in the ER at [facility]. ? ?We discussed the diagnosis, alternative treatment options, risks and benefits of the above surgical intervention, as well as alternative non-operative treatments. All questions/concerns were addressed and the patient/family demonstrated appropriate understanding of the diagnosis, the procedure, the postoperative course, and overall prognosis. The patient wished to proceed with surgical intervention and signed an informed surgical consent as such, in each others presence prior to surgery. ? ? ?PROCEDURE IN DETAIL: ?After preoperative consent was obtained and the correct operative site was identified, the patient was brought to the operating room supine on stretcher and transferred onto operating table. General anesthesia was induced. Preoperative antibiotics were administered. Surgical timeout was taken. The patient was then positioned supine with an ipsilateral hip bump. The operative lower extremity was prepped and draped in standard sterile fashion with a tourniquet around the thigh. The extremity was exsanguinated and the tourniquet was inflated to 250 mmHg. ? ?A  standard posterolateral incision was made just behind the distal fibula. There was serous blistering noted posterior to the planned incision that was decompressed during prepping. Dissection was carried down to the peroneals, fascia was incised and then further dissection performed down to level of the fibula with the fracture site identified. The superficial peroneal nerve was identified and protected throughout the procedure. The fibula was noted to be shortened with interposed periosteum. The fibula was brought out to length. The fibula fracture was debrided and the edges defined to achieve cortical read. Reduction maneuver was performed using pointed reduction forceps and lobster forceps. In this manner, the fibula length was restored and fracture reduced. A lag screw was not placed given the orientation of fracture lines and comminution. Due to poor bone quality and extensive comminution at the fracture site, it was decided to use an anatomic locking distal fibula plate. We provisionally held the fracture in reduction using Kirschner wires. ? ?We proceeded with fixation of the posterior malleolus. We confirmed appropriate reduction using intraoperative fluoroscopy. The posterior malleolus was approached using window between the peroneal tendons and the Achilles tendon. The flexor hallucis longus muscle belly was elevated off the posterior tibia to access the posterior malleolus. A Kirschner wire was used to provisionally fix the fracture. A 3 holed tubular plate was placed over this wire. We first secured the fracture and the plate using a 4.0 partially threaded solid screw. This was noted to achieve compression across the fracture with excellent congruency of the tibial articular surface on fluoroscopy. We then placed a second 4.0 fully threaded solid screw in the proximal hole of this plate to further secure the fracture. Position of both screws was verified carefully using intraoperative fluoroscopy  throughout. ? ?We then selected a Zimmer anatomic locking plate to  match the anatomy of the distal fibula and placed it laterally. This was implanted under intraoperative fluoroscopy with a combination of distal locking screws and proximal cortical & locking screws. A locking screw was also placed in middle of plate to secure the butterfly fragment. ? ?A manual external rotation stress radiograph was obtained and demonstrated widening of the ankle mortise. Given this intraoperative finding as well as preoperative subluxation, it was decided to reduce and fix the syndesmosis. Therefore a suture fixation system (ZipTight device) was implanted through the fibula plate in cannulated fashion to fix the syndesmosis. A medial counter incision was made over the distal medial malleolus as well. Anchor/button position was verified along anteromedial tibial cortex by fluoroscopy. A repeat stress radiograph showed complete stability of the ankle mortise to testing. ? ?The surgical sites were thoroughly irrigated. The tourniquet was deflated and hemostasis achieved. The deep layers were closed using 2-0 vicryl and the subcutaneous tissues closed using 3-0 vicryl. The skin was closed without tension using 3-0 nylon suture.  ?  ?The leg was cleaned with saline and sterile xeroform dressings with gauze were applied. A well padded bulky short leg splint was applied. The patient was awakened from anesthesia and transported to the recovery room in stable condition.  ? ? ?FOLLOW UP PLAN: ?-transfer to PACU, then return to inpatient floor ?-strict NWB operative extremity, maximum elevation ?-maintain short leg splint until follow up ?-DVT Ppx: Lovenox 40 mg once daily x 3 wk, followed by Aspirin 325 mg twice daily x another 3 wk while NWB (or as desired per primary team) ?-Discharge pain med: Oxycodone rx signed in chart  ?-cleared for discharge from ortho standpoint, follow up as outpatient in 7-10 days for wound check ?-sutures out in  2-3 weeks with exchange of short leg splint to short leg cast in outpatient office ? ? ?RADIOGRAPHS: ?AP, lateral, oblique and stress radiographs of the right ankle were obtained intraoperatively. These showed interval reduction and fixation of the fractures. Manual stress radiographs were taken and the ankle mortise and tibiofibular relationship were noted to be stable following fixation. All hardware is appropriately positioned and of the appropriate lengths. No other acute injuries are noted. ? ? ?Netta Cedars ?Orthopaedic Surgery ?[facility] ? ? ?

## 2021-10-30 NOTE — Anesthesia Postprocedure Evaluation (Signed)
Anesthesia Post Note ? ?Patient: Jessica Anthony ? ?Procedure(s) Performed: RIGHT TRIMALLEOLAR OPEN REDUCTION INTERNAL FIXATION (ORIF) ANKLE FRACTURE, (Right: Ankle) ? ?  ? ?Patient location during evaluation: PACU ?Anesthesia Type: General ?Level of consciousness: sedated and patient cooperative ?Pain management: pain level controlled ?Vital Signs Assessment: post-procedure vital signs reviewed and stable ?Respiratory status: spontaneous breathing ?Cardiovascular status: stable ?Anesthetic complications: no ? ? ?No notable events documented. ? ?Last Vitals:  ?Vitals:  ? 10/30/21 1209 10/30/21 1417  ?BP: 127/80 (!) 146/89  ?Pulse: 88 90  ?Resp: 12 14  ?Temp: 36.7 ?C 36.5 ?C  ?SpO2: 97% 99%  ?  ?Last Pain:  ?Vitals:  ? 10/30/21 1209  ?TempSrc: Oral  ?PainSc: 0-No pain  ? ? ?  ?  ?  ?  ?  ?  ? ?Lewie Loron ? ? ? ? ?

## 2021-10-30 NOTE — Hospital Course (Signed)
76 year old female with past medical history of HTN, HLD, depression presents with complaints of mechanical fall and found to have trimalleolar ankle fracture. ?3/25 underwent surgery. ?

## 2021-10-30 NOTE — Progress Notes (Signed)
Subjective: ?Day of Surgery Procedure(s) (LRB): ?RIGHT TRIMALLEOLAR OPEN REDUCTION INTERNAL FIXATION (ORIF) ANKLE FRACTURE, (Right) ?POSSIBLE EXTERNAL FIXATION ANKLE (Right) ? ?Patient reports pain as mild. She reports she has reviewed everything with son & partner - she is ready to pursue surgery today at [facility] and is looking forward to recovery here and at facility afterwards. ? ?Objective:  ? ?VITALS:  Temp:  [97.7 ?F (36.5 ?C)-98.2 ?F (36.8 ?C)] 97.7 ?F (36.5 ?C) (03/25 8250) ?Pulse Rate:  [77-93] 77 (03/25 0655) ?Resp:  [16-18] 18 (03/25 0655) ?BP: (137-159)/(86-90) 149/86 (03/25 0370) ?SpO2:  [97 %-98 %] 98 % (03/25 0655) ? ?Gen: AAOx3, NAD ?  ?Right lower extremity: ?Short leg splint in place ?Wiggles toes ?SILT over toes ?CR<2s ? ? ?LABS ?Recent Labs  ?  10/27/21 ?2150  ?HGB 13.5  ?WBC 10.5  ?PLT 251  ? ?Recent Labs  ?  10/27/21 ?2150 10/28/21 ?0629  ?NA 139 140  ?K 3.2* 3.8  ?CL 105 108  ?CO2 28 28  ?BUN 20 16  ?CREATININE 0.80 0.65  ?GLUCOSE 111* 96  ? ?No results for input(s): LABPT, INR in the last 72 hours. ? ? ?Assessment/Plan: ?Right trimalleolar ankle fracture - DOI 10/27/21 ?  ?-reviewed history, exam and imaging at length with patient and her son by phone at patient request ?-she is consented for right ankle ORIF with possible syndesmosis/deltoid repair and possible external fixation with delayed definitive fixation ?-plan for OR today Sat morning 10/30/21. NPO from [place] ?-pain mmt and VTE ppx per primary team ?-strict NWB RLE, max elevation (3 pillow minimum) ? ?Netta Cedars ?10/30/2021, 7:31 AM ? ?

## 2021-10-30 NOTE — Assessment & Plan Note (Addendum)
Right trimalleolar ankle fracture s/p mechanical fall ?-CT right ankle showed trimalleolar fracture with widening of the medial morsel and partial posterior tibial subluxation. Likely has unstable ankle.   ?3/25 underwent surgery, open reduction internal fixation.  Patient surgically ready for discharge to SNF.  Nonweightbearing on the affected surgical right limb. ?Pain medication and DVT prophylaxis per orthopedics, who has recommended Lovenox 40 mg subcu for 3 weeks and aspirin 325 mg twice daily for another 3 weeks. ?

## 2021-10-30 NOTE — Brief Op Note (Signed)
10/30/2021 ? ?11:20 AM ? ?PATIENT:  Jessica Anthony  76 y.o. female ? ?PRE-OPERATIVE DIAGNOSIS:  right ankle fracture ? ?POST-OPERATIVE DIAGNOSIS:  right ankle fracture ? ?PROCEDURE:  Procedure(s): ?RIGHT TRIMALLEOLAR OPEN REDUCTION INTERNAL FIXATION (ORIF) ANKLE FRACTURE, (Right) ?Right ankle syndesmosis ORIF ? ?SURGEON:  Surgeon(s) and Role: ?   Netta Cedars, MD - Primary ? ?PHYSICIAN ASSISTANT: None ? ?ASSISTANTS: none  ? ?ANESTHESIA:   regional and general ? ?EBL:  Minimal  ? ?BLOOD ADMINISTERED:none ? ?DRAINS: none  ? ?LOCAL MEDICATIONS USED:  OTHER Vancomycin powder ? ?SPECIMEN:  No Specimen ? ?DISPOSITION OF SPECIMEN:  N/A ? ?COUNTS:  YES ? ?TOURNIQUET:   ?Total Tourniquet Time Documented: ?Thigh (Right) - 131 minutes ?Total: Thigh (Right) - 131 minutes ? ? ?DICTATION: .Note written in EPIC ? ?PLAN OF CARE:  Return to inpatient ? ?PATIENT DISPOSITION:  PACU - hemodynamically stable. ?  ?Delay start of Pharmacological VTE agent (>24hrs) due to surgical blood loss or risk of bleeding: no ? ?

## 2021-10-30 NOTE — Progress Notes (Signed)
?  Progress Note ? ? ?Patient: Jessica Anthony ZOX:[id number] DOB: [birth year] DOA: 10/27/2021     Hospitalization day: 2 ?DOS: the patient was seen and examined on 10/30/2021 ? ?Brief hospital course: ?76 year old female with past medical history of HTN, HLD, depression presents with complaints of mechanical fall and found to have trimalleolar ankle fracture. ?3/25 underwent surgery. ? ?Assessment and Plan: ?* Trimalleolar fracture of ankle, closed, right, initial encounter ?Right trimalleolar ankle fracture s/p mechanical fall ?-CT right ankle showed trimalleolar fracture with widening of the medial morsel and partial posterior tibial subluxation. Likely has unstable ankle.   ?3/25 underwent surgery, open reduction internal fixation.  Patient surgically ready for discharge to SNF.  Nonweightbearing on the affected surgical right limb. ? ?Hypokalemia ?Replaced.   ? ?Anxiety ?depression ?No evidence of worsening. ?On Zoloft and Ativan.  Will resume Zoloft. ? ?Essential hypertension ?Blood pressure stable. ?On losartan HCTZ and Norvasc at home. ?I do not think the patient requires 3 medication regimen for now. ?Will initiate Norvasc. ? ?Subjective: No nausea no vomiting no fever no chills.  No chest pain abdominal pain.  Pain well controlled in the ankle. ? ?Physical Exam: ?Vitals:  ? 10/30/21 1130 10/30/21 1145 10/30/21 1209 10/30/21 1417  ?BP: 117/78 122/73 127/80 (!) 146/89  ?Pulse: 94 91 88 90  ?Resp: 10 12 12 14   ?Temp: 98.1 ?F (36.7 ?C)  98 ?F (36.7 ?C) 97.7 ?F (36.5 ?C)  ?TempSrc:   Oral   ?SpO2: 96% 96% 97% 99%  ?Weight:      ?Height:      ? ?General: Appear in mild distress; no visible Abnormal Neck Mass Or lumps, Conjunctiva normal ?Cardiovascular: S1 and S2 Present, no Murmur, ?Respiratory: good respiratory effort, Bilateral Air entry present and CTA, no Crackles, no wheezes ?Abdomen: Bowel Sound present, Non tender ?Extremities: no Pedal edema ?Neurology: alert and oriented to time, place, and person ?Gait not  checked due to patient safety concerns  ? ?Data Reviewed: ?I have Reviewed nursing notes, Vitals, and Lab results since pt's last encounter. Pertinent lab results none ?I have ordered test including CBC and BMP ?I have discussed pt's care plan and test results with orthopedics.  ? ?Family Communication: None ? ?Disposition: ?Status is: Inpatient ?Remains inpatient appropriate because: Unsafe discharge, needs SNF. ? ?Author: ? , MD ?10/30/2021 5:21 PM ? ?For on call review [email or web address]. ?

## 2021-10-30 NOTE — Anesthesia Procedure Notes (Signed)
Anesthesia Regional Block: Popliteal block  ? ?Pre-Anesthetic Checklist: , timeout performed,  Correct Patient, Correct Site, Correct Laterality,  Correct Procedure, Correct Position, site marked,  Risks and benefits discussed,  Surgical consent,  Pre-op evaluation,  At surgeon's request and post-op pain management ? ?Laterality: Lower and Right ? ?Prep: chloraprep     ?  ?Needles:  ?Injection technique: Single-shot ? ?Needle Type: Stimiplex   ? ? ?Needle Length: 10cm  ?Needle Gauge: 21  ? ? ? ?Additional Needles: ? ? ?Procedures:,,,, ultrasound used (permanent image in chart),,   ?Motor weakness within 5 minutes.  ?Narrative:  ?Start time: 10/30/2021 7:39 AM ?End time: 10/30/2021 7:42 AM ?Injection made incrementally with aspirations every 5 mL. ? ?Performed by: Personally  ?Anesthesiologist: Jessica Loron, MD ? ?Additional Notes: ?Nerve located and needle positioned with direct ultrasound guidance. Good perineural spread. Patient tolerated well. ? ? ? ? ?

## 2021-10-30 NOTE — Anesthesia Procedure Notes (Signed)
Procedure Name: Intubation ?Date/Time: 10/30/2021 7:55 AM ?Performed by: Raenette Rover, CRNA ?Pre-anesthesia Checklist: Patient identified, Emergency Drugs available, Suction available and Patient being monitored ?Patient Re-evaluated:Patient Re-evaluated prior to induction ?Oxygen Delivery Method: Circle system utilized ?Preoxygenation: Pre-oxygenation with 100% oxygen ?Induction Type: IV induction ?Ventilation: Mask ventilation without difficulty and Oral airway inserted - appropriate to patient size ?Laryngoscope Size: Mac and 3 ?Grade View: Grade I ?Tube type: Oral ?Tube size: 7.0 mm ?Number of attempts: 1 ?Airway Equipment and Method: Stylet and Oral airway ?Placement Confirmation: ETT inserted through vocal cords under direct vision, positive ETCO2 and breath sounds checked- equal and bilateral ?Secured at: 21 cm ?Tube secured with: Tape ?Dental Injury: Teeth and Oropharynx as per pre-operative assessment  ? ? ? ? ?

## 2021-10-30 NOTE — Discharge Instructions (Signed)
Jessica Cedars, MD ?[facility] ? ?Please read the following information regarding your care after surgery. ? ?Medications  ?You only need a prescription for the narcotic pain medicine (ex. oxycodone, Percocet, Norco).  All of the other medicines listed below are available over the counter. ?? Aleve 2 pills twice a day for the first 3 days after surgery. ?? acetominophen (Tylenol) 650 mg every 4-6 hours as you need for minor to moderate pain ?? oxycodone as prescribed for severe pain ? ?? To help prevent blood clots, take lovenox 40 mg once daily after surgery for 3 weeks followed by aspirin 325 mg BID for a further 3 weeks.  You should also get up every hour while you are awake to move around. ? ?Weight Bearing ?? Do NOT bear any weight on the operated leg or foot. ? ?Cast / Splint / Dressing ?? If you have a splint, do NOT remove this. Keep your splint, cast or dressing clean and dry.  Don?t put anything (coat hanger, pencil, etc) down inside of it.  If it gets wet, call the office immediately to schedule an appointment for a cast change. ? ?Swelling ?IMPORTANT: It is normal for you to have swelling where you had surgery. To reduce swelling and pain, keep at least 3 pillows under your leg so that your toes are above your nose and your heel is above the level of your hip.  It may be necessary to keep your foot or leg elevated for several weeks.  This is critical to helping your incisions heal and your pain to feel better. ? ?Follow Up ?Call my office at [phone] when you are discharged from the hospital or surgery center to schedule an appointment to be seen 7-10 days after surgery. ? ?Call my office at [phone] if you develop a fever >101.5? F, nausea, vomiting, bleeding from the surgical site or severe pain.   ? ? ?

## 2021-10-30 NOTE — Transfer of Care (Signed)
Immediate Anesthesia Transfer of Care Note ? ?Patient: Jessica Anthony ? ?Procedure(s) Performed: RIGHT TRIMALLEOLAR OPEN REDUCTION INTERNAL FIXATION (ORIF) ANKLE FRACTURE, (Right: Ankle) ? ?Patient Location: PACU ? ?Anesthesia Type:GA combined with regional for post-op pain ? ?Level of Consciousness: drowsy and patient cooperative ? ?Airway & Oxygen Therapy: Patient Spontanous Breathing and Patient connected to face mask oxygen ? ?Post-op Assessment: Report given to RN and Post -op Vital signs reviewed and stable ? ?Post vital signs: Reviewed and stable ? ?Last Vitals:  ?Vitals Value Taken Time  ?BP 100/56 10/30/21 1057  ?Temp 37.3 ?C 10/30/21 1057  ?Pulse 78 10/30/21 1059  ?Resp 12 10/30/21 1059  ?SpO2 100 % 10/30/21 1059  ?Vitals shown include unvalidated device data. ? ?Last Pain:  ?Vitals:  ? 10/30/21 1057  ?TempSrc:   ?PainSc: Asleep  ?   ? ?  ? ?Complications: No notable events documented. ?

## 2021-10-30 NOTE — H&P (Signed)
H&P Update: ? ?-History and Physical Reviewed ? ?-Patient has been re-examined ? ?-No change in the plan of care ? ?-The risks and benefits were presented and reviewed. The risks due to hardware failure/irritation, new/persistent infection, stiffness, nerve/vessel/tendon injury, nonunion/malunion, wound healing issues, development of arthritis, failure of this surgery, possibility of external fixation with delayed definitive surgery, need for further surgery, thromboembolic events, anesthesia/medical complications, amputation, death among others were discussed. The patient acknowledged the explanation, agreed to proceed with the plan and a consent was signed. ? ?Jessica Anthony ? ?

## 2021-10-30 NOTE — Anesthesia Procedure Notes (Signed)
Anesthesia Regional Block: Adductor canal block  ? ?Pre-Anesthetic Checklist: , timeout performed,  Correct Patient, Correct Site, Correct Laterality,  Correct Procedure, Correct Position, site marked,  Risks and benefits discussed,  Surgical consent,  Pre-op evaluation,  At surgeon's request and post-op pain management ? ?Laterality: Lower and Right ? ?Prep: chloraprep     ?  ?Needles:  ?Injection technique: Single-shot ? ?Needle Type: Stimiplex   ? ? ?Needle Length: 9cm  ?Needle Gauge: 21  ? ? ? ?Additional Needles: ? ? ?Procedures:,,,, ultrasound used (permanent image in chart),,    ?Narrative:  ?Start time: 10/30/2021 7:35 AM ?End time: 10/30/2021 7:39 AM ?Injection made incrementally with aspirations every 5 mL. ? ?Performed by: Personally  ?Anesthesiologist: Lewie Loron, MD ? ?Additional Notes: ?BP cuff, EKG monitors applied. Sedation begun. Artery and nerve location verified with ultrasound. Anesthetic injected incrementally (65ml), slowly, and after negative aspirations under direct u/s guidance. Good fascial/perineural spread. Tolerated well. ? ? ? ? ?

## 2021-10-31 DIAGNOSIS — S82851A Displaced trimalleolar fracture of right lower leg, initial encounter for closed fracture: Secondary | ICD-10-CM | POA: Diagnosis not present

## 2021-10-31 LAB — BASIC METABOLIC PANEL
Anion gap: 9 (ref 5–15)
BUN: 21 mg/dL (ref 8–23)
CO2: 26 mmol/L (ref 22–32)
Calcium: 9 mg/dL (ref 8.9–10.3)
Chloride: 103 mmol/L (ref 98–111)
Creatinine, Ser: 0.74 mg/dL (ref 0.44–1.00)
GFR, Estimated: >60 mL/min (ref >60)
Glucose, Bld: 121 mg/dL — ABNORMAL HIGH (ref 70–99)
Potassium: 3.9 mmol/L (ref 3.5–5.1)
Sodium: 138 mmol/L (ref 135–145)

## 2021-10-31 LAB — CBC
HCT: 36.9 % (ref 36.0–46.0)
Hemoglobin: 11.9 g/dL — ABNORMAL LOW (ref 12.0–15.0)
MCH: 28.5 pg (ref 26.0–34.0)
MCHC: 32.2 g/dL (ref 30.0–36.0)
MCV: 88.3 fL (ref 80.0–100.0)
Platelets: 241 10*3/uL (ref 150–400)
RBC: 4.18 MIL/uL (ref 3.87–5.11)
RDW: 14 % (ref 11.5–15.5)
WBC: 11.6 10*3/uL — ABNORMAL HIGH (ref 4.0–10.5)
nRBC: 0 % (ref 0.0–0.2)

## 2021-10-31 MED ORDER — AMLODIPINE BESYLATE 5 MG PO TABS
5.0000 mg | ORAL_TABLET | Freq: Every day | ORAL | Status: DC
Start: 2021-10-31 — End: 2021-11-02
  Administered 2021-10-31 – 2021-11-02 (×3): 5 mg via ORAL
  Filled 2021-10-31 (×3): qty 1

## 2021-10-31 MED ORDER — POLYETHYLENE GLYCOL 3350 17 G PO PACK: 17.0000 g | PACK | Freq: Every day | ORAL | Status: DC | PRN

## 2021-10-31 MED ORDER — FENOFIBRATE 54 MG PO TABS
54.0000 mg | ORAL_TABLET | Freq: Every day | ORAL | Status: DC
  Administered 2021-10-31 – 2021-11-02 (×3): 54 mg via ORAL
  Filled 2021-10-31 (×3): qty 1

## 2021-10-31 MED ORDER — SERTRALINE HCL 50 MG PO TABS
50.0000 mg | ORAL_TABLET | Freq: Every day | ORAL | Status: DC
  Administered 2021-10-31 – 2021-11-02 (×3): 50 mg via ORAL
  Filled 2021-10-31 (×3): qty 1

## 2021-10-31 MED ORDER — LORAZEPAM 1 MG PO TABS
1.0000 mg | ORAL_TABLET | Freq: Two times a day (BID) | ORAL | Status: DC | PRN
  Administered 2021-10-31: 1 mg via ORAL
  Filled 2021-10-31: qty 1

## 2021-10-31 MED ORDER — LORAZEPAM 1 MG PO TABS
1.0000 mg | ORAL_TABLET | Freq: Two times a day (BID) | ORAL | 0 refills | Status: AC | PRN
Start: 2021-10-31 — End: ?

## 2021-10-31 MED ORDER — DOCUSATE SODIUM 100 MG PO CAPS
100.0000 mg | ORAL_CAPSULE | Freq: Two times a day (BID) | ORAL | 0 refills | Status: AC
  Filled 2021-10-31: qty 10, 5d supply, fill #0

## 2021-10-31 MED ORDER — ENOXAPARIN SODIUM 40 MG/0.4ML IJ SOSY: 40.0000 mg | PREFILLED_SYRINGE | INTRAMUSCULAR | 0 refills | Status: AC

## 2021-10-31 MED ORDER — PRAVASTATIN SODIUM 20 MG PO TABS
40.0000 mg | ORAL_TABLET | Freq: Every day | ORAL | Status: DC
Start: 2021-10-31 — End: 2021-11-02
  Administered 2021-10-31 – 2021-11-02 (×3): 40 mg via ORAL
  Filled 2021-10-31 (×3): qty 2

## 2021-10-31 MED ORDER — ASPIRIN EC 325 MG PO TBEC: 325.0000 mg | DELAYED_RELEASE_TABLET | Freq: Two times a day (BID) | ORAL | 0 refills | Status: AC

## 2021-10-31 NOTE — Progress Notes (Signed)
Subjective: ?1 Day Post-Op Procedure(s) (LRB): ?RIGHT TRIMALLEOLAR OPEN REDUCTION INTERNAL FIXATION (ORIF) ANKLE FRACTURE, (Right) ?Patient seen in rounds for Dr. Odis Hollingshead ?Patient reports pain as mild.   ?Denies any N/T, fevers/chills, SOB/CP ? ? ?Objective: ?Vital signs in last 24 hours: ?Temp:  [97.7 ?F (36.5 ?C)-99.1 ?F (37.3 ?C)] 98 ?F (36.7 ?C) (03/26 0352) ?Pulse Rate:  [76-94] 77 (03/26 0352) ?Resp:  [10-16] 15 (03/26 0352) ?BP: (94-146)/(56-89) 122/76 (03/26 0352) ?SpO2:  [96 %-100 %] 98 % (03/26 0352) ? ?Intake/Output from previous day: ?03/25 0701 - 03/26 0700 ?In: 1540 [P.O.:340; I.V.:1100; IV Piggyback:100] ?Out: 910 [Urine:900; Blood:10] ?Intake/Output this shift: ?No intake/output data recorded. ? ?Recent Labs  ?  10/31/21 ?0323  ?HGB 11.9*  ? ?Recent Labs  ?  10/31/21 ?0323  ?WBC 11.6*  ?RBC 4.18  ?HCT 36.9  ?PLT 241  ? ?Recent Labs  ?  10/31/21 ?0323  ?NA 138  ?K 3.9  ?CL 103  ?CO2 26  ?BUN 21  ?CREATININE 0.74  ?GLUCOSE 121*  ?CALCIUM 9.0  ? ?No results for input(s): LABPT, INR in the last 72 hours. ? ?Neurologically intact ?Neurovascular intact ?Sensation intact distally ?Intact pulses distally ?No cellulitis present ?Compartment soft ?Still unable to move toes ? ? ?Assessment/Plan: ?1 Day Post-Op Procedure(s) (LRB): ?RIGHT TRIMALLEOLAR OPEN REDUCTION INTERNAL FIXATION (ORIF) ANKLE FRACTURE, (Right) ?Advance diet ?NWB on right LE ?DVT ppx: lovenox 40 mg subQ x 3 weeks, ASA 325 mg BID x another 3 weeks ?Remain in short leg splint until follow up in the office in 7-10 days ?Cleared from ortho standpoint ? ? ?Cherie Dark, PA-c ?[facility] ?[phone] ?10/31/2021, 8:39 AM ? ?

## 2021-10-31 NOTE — TOC Progression Note (Addendum)
Transition of Care (TOC) - Progression Note  ? ? ?Patient Details  ?Name: Jessica Anthony ?MRN: [id number] ?Date of Birth: November 29, 1945 ? ?Transition of Care (TOC) CM/SW Contact  ?Ross Ludwig, LCSW ?Phone Number: ?10/31/2021, 6:34 PM ? ?Clinical Narrative:    ?Patient has been faxed out to SNFs for short term rehab.  CSW awaiting bed offers, and patient will need insurance authorization before she is able to go to a SNF.  Patient will also need PASSAR number prior to discharge. ? ? ?Expected Discharge Plan: [place] (vs. HH) ?Barriers to Discharge: Continued Medical Work up ? ?Expected Discharge Plan and Services ?Expected Discharge Plan: [place] (vs. HH) ?In-house Referral: Clinical Social Work ?  ?  ?Living arrangements for the past 2 months: [place] ?                ?  ?  ?  ?  ?  ?  ?  ?  ?  ?  ? ? ?Social Determinants of Health (SDOH) Interventions ?  ? ?Readmission Risk Interventions ? ?  10/29/2021  ?  3:48 PM  ?Readmission Risk Prevention Plan  ?Post Dischage Appt Complete  ?Medication Screening Complete  ?Transportation Screening Complete  ? ? ?

## 2021-10-31 NOTE — Plan of Care (Signed)
  Problem: Education: Goal: Knowledge of General Education information will improve Description: Including pain rating scale, medication(s)/side effects and non-pharmacologic comfort measures Outcome: Progressing   Problem: Activity: Goal: Risk for activity intolerance will decrease Outcome: Progressing   Problem: Pain Managment: Goal: General experience of comfort will improve Outcome: Progressing   

## 2021-10-31 NOTE — Discharge Summary (Signed)
?Physician Discharge Summary ?  ?Patient: Jessica LefevreMary Anthony MRN: [id number] DOB: Jul 14, 1946  ?Admit date:     10/27/2021  ?Discharge date: 10/31/21  ?Discharge Physician: Lynden Oxfordranav Raynette Arras  ?PCP: Merri BrunetteSmith, Candace, MD ? ?Recommendations at discharge: ?Follow-up with orthopedics in 7 to 10 days. ? ?Discharge Diagnoses: ?Principal Problem: ?  Trimalleolar fracture of ankle, closed, right, initial encounter ?Active Problems: ?  Essential hypertension ?  Anxiety ?  Depression ?  Hypokalemia ? ? ?Hospital Course: ?76 year old female with past medical history of HTN, HLD, depression presents with complaints of mechanical fall and found to have trimalleolar ankle fracture. ?3/25 underwent surgery. ? ?Assessment and Plan: ?* Trimalleolar fracture of ankle, closed, right, initial encounter ?Right trimalleolar ankle fracture s/p mechanical fall ?-CT right ankle showed trimalleolar fracture with widening of the medial morsel and partial posterior tibial subluxation. Likely has unstable ankle.   ?3/25 underwent surgery, open reduction internal fixation.  Patient surgically ready for discharge to SNF.  Nonweightbearing on the affected surgical right limb. ?Pain medication and DVT prophylaxis per orthopedics, who has recommended Lovenox 40 mg subcu for 3 weeks and aspirin 325 mg twice daily for another 3 weeks. ? ?Hypokalemia ?Replaced.   ? ?Anxiety ?Depression ?Mood appears to be stable.  Somewhat forgetful intermittently postoperatively. ?On Zoloft and Ativan.  Continue Zoloft.  Patient is on scheduled Ativan which is switch to as needed. ? ?Essential hypertension ?Blood pressure stable. ?On losartan HCTZ and Norvasc at home. ?I do not think the patient requires 3 medication regimen for now. ?Will initiate Norvasc. ? ?Pain control - [employer] [place] Controlled Substance Reporting System database was reviewed. and patient was instructed, not to drive, operate heavy machinery, perform activities at heights, swimming or participation in water  activities or provide baby-sitting services while on Pain, Sleep and Anxiety Medications; until their outpatient Physician has advised to do so again. Also recommended to not to take more than prescribed Pain, Sleep and Anxiety Medications.  ? ?Consultants: Orthopedics  ?Procedures performed:  ?Right trimalleolar ankle fracture open reduction internal fixation with fixation of posterior lip ?Right ankle syndesmosis open reduction internal fixation ? ?DISCHARGE MEDICATION: ?Allergies as of 10/31/2021   ? ?   Reactions  ? Amoxicillin Other (See Comments)  ? Anxious feeling  ? Statins   ? Muscle aches with all Statins  ? ?  ? ?  ?Medication List  ?  ? ?STOP taking these medications   ? ?hydrochlorothiazide 25 MG tablet ?Commonly known as: HYDRODIURIL ?  ?losartan 100 MG tablet ?Commonly known as: COZAAR ?  ? ?  ? ?TAKE these medications   ? ?amLODipine 5 MG tablet ?Commonly known as: NORVASC ?Take 5 mg by mouth daily. ?  ?aspirin EC 325 MG tablet ?Take 1 tablet (325 mg total) by mouth in the morning and at bedtime for 21 days. ?Start taking on: November 21, 2021 ?  ?CALCIUM-MAGNESUIUM-ZINC 333-133-8.3 MG Tabs ?Take 1 tablet by mouth daily. ?  ?docusate sodium 100 MG capsule ?Commonly known as: Colace ?Take 1 capsule (100 mg total) by mouth 2 (two) times daily. ?  ?enoxaparin 40 MG/0.4ML injection ?Commonly known as: LOVENOX ?Inject 0.4 mLs (40 mg total) into the skin daily for 20 days. ?  ?fenofibrate 54 MG tablet ?Take 54 mg by mouth daily. ?  ?folic acid 400 MCG tablet ?Commonly known as: FOLVITE ?Take 400 mcg by mouth 3 (three) times a week. No specific days ?  ?LORazepam 1 MG tablet ?Commonly known as: ATIVAN ?Take 1 tablet (1 mg total)  by mouth 2 (two) times daily as needed for anxiety. ?What changed:  ?when to take this ?reasons to take this ?  ?ondansetron 4 MG tablet ?Commonly known as: ZOFRAN ?Take 1 tablet (4 mg total) by mouth every 6 (six) hours as needed for nausea. ?  ?oxyCODONE 5 MG immediate release  tablet ?Commonly known as: Oxy IR/ROXICODONE ?Take 1 tablet (5 mg total) by mouth every 6 (six) hours as needed for up to 7 days for severe pain. ?  ?polyethylene glycol 17 g packet ?Commonly known as: MIRALAX / GLYCOLAX ?Take 17 g by mouth daily as needed for mild constipation. ?  ?polyvinyl alcohol 1.4 % ophthalmic solution ?Commonly known as: LIQUIFILM TEARS ?Place 1 drop into both eyes as needed for dry eyes. ?  ?pravastatin 40 MG tablet ?Commonly known as: PRAVACHOL ?Take 40 mg by mouth daily. ?  ?sertraline 50 MG tablet ?Commonly known as: ZOLOFT ?Take 50 mg by mouth daily. ?  ?triamcinolone cream 0.1 % ?Commonly known as: KENALOG ?Apply 1 application topically 2 (two) times daily as needed for itching. ?  ?VITAMIN D PO ?Take 1,000 mcg by mouth daily. ?  ? ?  ? ? ?Disposition: Skilled nursing facility ?Diet recommendation:  ?Discharge Exam: ?Filed Weights  ? 10/27/21 2015  ?Weight: 55.3 kg  ? ?General: Appear in no distress; no visible Abnormal Neck Mass Or lumps, Conjunctiva normal ?Cardiovascular: S1 and S2 Present, no Murmur, ?Respiratory: good respiratory effort, Bilateral Air entry present and CTA, no Crackles, no wheezes ?Abdomen: Bowel Sound present Non tender ?Extremities: no Pedal edema ?Neurology: alert and oriented to time, place, and person ?Gait not checked due to patient safety concerns  ? ?Condition at discharge: good ? ?The results of significant diagnostics from this hospitalization (including imaging, microbiology, ancillary and laboratory) are listed below for reference.  ? ?Imaging Studies: ?DG Pelvis 1-2 Views ? ?Result Date: 10/27/2021 ?CLINICAL DATA:  Fall EXAM: PELVIS - 1-2 VIEW COMPARISON:  07/08/2021 FINDINGS: There is no evidence of pelvic fracture or diastasis. No pelvic bone lesions are seen. 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 IMPRESSION: Negative. Electronically Signed   By: Deatra Robinson M.D.   On: 10/27/2021 23:09  ? ?DG Ankle Complete Right ? ?Result Date:  10/30/2021 ?CLINICAL DATA:  ORIF right ankle fractures. EXAM: OPERATIVE RIGHT ANKLE (WITH PELVIS IF PERFORMED) 6 VIEWS TECHNIQUE: Fluoroscopic spot image(s) were submitted for interpretation post-operatively. COMPARISON:  10/27/2021 FINDINGS: Submitted portable images demonstrate placement of a lateral fixation plate across the distal fibula reducing the fibular fracture into near anatomic alignment. A posterior fusion plate lies along the posterior margin of the distal tibia reducing the posterior malleolar fracture into near anatomic alignment. The talus is now normally aligned with the tibia. Orthopedic hardware appears well seated. IMPRESSION: Well-aligned right ankle fractures following ORIF. Electronically Signed   By: Amie Portland M.D.   On: 10/30/2021 10:39  ? ?DG Ankle Complete Right ? ?Result Date: 10/27/2021 ?CLINICAL DATA:  Ankle pain. EXAM: RIGHT ANKLE - COMPLETE 3+ VIEW COMPARISON:  None. FINDINGS: There is an acute comminuted fracture of the distal fibular diaphysis above the level of the ankle mortise. There is apex anterior angulation. There is an acute vertically oriented fracture through the posterior malleolus, minimally displaced. There is an acute transverse comminuted fracture through the medial malleolus. There is 7 mm of medial and anterior subluxation of the distal tibia in relation to the talar dome. There is soft tissue swelling surrounding the ankle. IMPRESSION: 1. Acute displaced trimalleolar  ankle fracture. 2. Anteromedial displacement of the distal tibia in relation to the talus. Electronically Signed   By: Darliss Cheney M.D.   On: 10/27/2021 20:58  ? ?CT HEAD WO CONTRAST ( ) ? ?Result Date: 10/27/2021 ?CLINICAL DATA:  Trip and fall. EXAM: CT HEAD WITHOUT CONTRAST CT CERVICAL SPINE WITHOUT CONTRAST TECHNIQUE: Multidetector CT imaging of the head and cervical spine was performed following the standard protocol without intravenous contrast. Multiplanar CT image reconstructions of the  cervical spine were also generated. RADIATION DOSE REDUCTION: This exam was performed according to the departmental dose-optimization program which includes automated exposure control, adjustment of the mA and/or kV ac

## 2021-10-31 NOTE — Evaluation (Signed)
Physical Therapy Evaluation ?Patient Details ?Name: Jessica Anthony ?MRN: [id number] ?DOB: 1946/01/01 ?Today's Date: 10/31/2021 ? ?History of Present Illness ? Pt s/p fall with R ankle fx.  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  ?Clinical Impression ? Pt s/p R ankle fx and presents with decreased R LE strength/ROM, post op pain, ambulatory balance deficits, NWB on R LE and elevated anxiety level limiting functional mobility.  Pt would benefit from follow up rehab at SNF level to maximize IND and safety prior to return home with limited assist. ?   ? ?Recommendations for follow up therapy are one component of a multi-disciplinary discharge planning process, led by the attending physician.  Recommendations may be updated based on patient status, additional functional criteria and insurance authorization. ? ?Follow Up Recommendations Skilled nursing-short term rehab (<3 hours/day) ? ?  ?Assistance Recommended at Discharge Frequent or constant Supervision/Assistance  ?Patient can return home with the following ? A lot of help with walking and/or transfers;A little help with bathing/dressing/bathroom;Assistance with cooking/housework;Assist for transportation;Help with stairs or ramp for entrance ? ?  ?Equipment Recommendations None recommended by PT  ?Recommendations for Other Services ?    ?  ?Functional Status Assessment Patient has had a recent decline in their functional status and demonstrates the ability to make significant improvements in function in a reasonable and predictable amount of time.  ? ?  ?Precautions / Restrictions Precautions ?Precautions: Fall ?Restrictions ?Weight Bearing Restrictions: Yes ?RLE Weight Bearing: Non weight bearing  ? ?  ? ?Mobility ? Bed Mobility ?Overal bed mobility: Needs Assistance ?Bed Mobility: Supine to Sit ?  ?  ?Supine to sit: Min assist ?  ?  ?General bed mobility comments: assist for R LE ?  ? ?Transfers ?Overall transfer level: Needs assistance ?Equipment used:  Rolling walker (2 wheels) ?Transfers: Sit to/from Stand ?Sit to Stand: From elevated surface, Min assist, Mod assist ?  ?  ?  ?  ?  ?General transfer comment: cues for LE management to insure NWB and for use of UEs to self assist.  Physical assist to bring wt up and fwd and to balance in standing ?  ? ?Ambulation/Gait ?Ambulation/Gait assistance: Min assist ?Gait Distance (Feet): 9 Feet ?Assistive device: Rolling walker (2 wheels) ?Gait Pattern/deviations: Step-to pattern, Decreased step length - right, Decreased step length - left, Shuffle, Trunk flexed ?Gait velocity: decr ?  ?  ?General Gait Details: cues for posture, position from RW, NWB on R LE, sequence ? ?Stairs ?  ?  ?  ?  ?  ? ?Wheelchair Mobility ?  ? ?Modified Rankin (Stroke Patients Only) ?  ? ?  ? ?Balance Overall balance assessment: Needs assistance ?Sitting-balance support: No upper extremity supported, Feet supported ?Sitting balance-Leahy Scale: Good ?  ?  ?Standing balance support: Bilateral upper extremity supported ?Standing balance-Leahy Scale: Poor ?  ?  ?  ?  ?  ?  ?  ?  ?  ?  ?  ?  ?   ? ? ? ?Pertinent Vitals/Pain Pain Assessment ?Pain Assessment: 0-10 ?Pain Score: 2  ?Pain Location: R ankle ?Pain Descriptors / Indicators: Aching, Sore ?Pain Intervention(s): Limited activity within patient's tolerance, Monitored during session, Premedicated before session  ? ? ?Home Living Family/patient expects to be discharged to:: Skilled nursing facility ?  ?  ?  ?  ?  ?  ?  ?  ?  ?   ?  ?Prior Function Prior Level of Function :  Independent/Modified Independent ?  ?  ?  ?  ?  ?  ?  ?  ?  ? ? ?Hand Dominance  ?   ? ?  ?Extremity/Trunk Assessment  ? Upper Extremity Assessment ?Upper Extremity Assessment: Overall WFL for tasks assessed ?  ? ?Lower Extremity Assessment ?Lower Extremity Assessment: RLE deficits/detail ?RLE Deficits / Details: splinting in place ?  ? ?Cervical / Trunk Assessment ?Cervical / Trunk Assessment: Normal  ?Communication  ?  Communication: No difficulties  ?Cognition Arousal/Alertness: Awake/alert ?Behavior During Therapy: [facility] for tasks assessed/performed ?Overall Cognitive Status: Within Functional Limits for tasks assessed ?  ?  ?  ?  ?  ?  ?  ?  ?  ?  ?  ?  ?  ?  ?  ?  ?  ?  ?  ? ?  ?General Comments   ? ?  ?Exercises    ? ?Assessment/Plan  ?  ?PT Assessment Patient needs continued PT services  ?PT Problem List Decreased strength;Decreased range of motion;Decreased activity tolerance;Decreased balance;Decreased mobility;Decreased knowledge of use of DME;Pain ? ?   ?  ?PT Treatment Interventions DME instruction;Gait training;Functional mobility training;Therapeutic activities;Therapeutic exercise;Balance training;Patient/family education   ? ?PT Goals (Current goals can be found in the Care Plan section)  ?Acute Rehab PT Goals ?Patient Stated Goal: Regain IND ?PT Goal Formulation: With patient ?Time For Goal Achievement: 11/14/21 ?Potential to Achieve Goals: Good ? ?  ?Frequency Min 3X/week ?  ? ? ?Co-evaluation   ?  ?  ?  ?  ? ? ?  ?AM-PAC PT "6 Clicks" Mobility  ?Outcome Measure Help needed turning from your back to your side while in a flat bed without using bedrails?: A Little ?Help needed moving from lying on your back to sitting on the side of a flat bed without using bedrails?: A Little ?Help needed moving to and from a bed to a chair (including a wheelchair)?: A Little ?Help needed standing up from a chair using your arms (e.g., wheelchair or bedside chair)?: A Lot ?Help needed to walk in hospital room?: Total ?Help needed climbing 3-5 steps with a railing? : Total ?6 Click Score: 13 ? ?  ?End of Session Equipment Utilized During Treatment: Gait belt ?Activity Tolerance: Patient tolerated treatment well ?Patient left: in chair;with call bell/phone within reach;with chair alarm set ?Nurse Communication: Mobility status ?PT Visit Diagnosis: Difficulty in walking, not elsewhere classified (R26.2) ?  ? ?Time: [id number] ?PT Time  Calculation (min) (ACUTE ONLY): 17 min ? ? ?Charges:   PT Evaluation ?$PT Eval Low Complexity: 1 Low ?  ?  ?   ? ? ?Jessica Anthony PT ?Acute Rehabilitation Services ?Pager [phone] ?Office [phone] ? ? ?Jessica Anthony ?10/31/2021, 4:18 PM ? ?

## 2021-10-31 NOTE — [place]2 (Addendum)
?[place] MEDICAID FL2 LEVEL OF CARE SCREENING TOOL  ?  ? ?IDENTIFICATION  ?Patient Name: ?Jessica Anthony Birthdate: 1945-11-01 Sex: female Admission Date (Current Location): ?10/27/2021  ?[place] Number: ? [place] ?  Facility and Address:  ?[facility],  [address] [place], [place] [address] ?     Provider Number: ?[id number]  ?Attending Physician Name and Address:  ?Rolly Salter, MD ? Relative Name and Phone Number:  ?Quentin Angst Son   [phone]  Vicente Serene   [phone] ?   ?Current Level of Care: ?Hospital Recommended Level of Care: ?Skilled Nursing Facility Prior Approval Number: ?  ? ?Date Approved/Denied: ?  PASRR Number: ?[id number] A ? ?Discharge Plan: ?SNF ?  ? ?Current Diagnoses: ?Patient Active Problem List  ? Diagnosis Date Noted  ? Hypokalemia 10/28/2021  ? Trimalleolar fracture of ankle, closed, right, initial encounter 10/28/2021  ? Femoral neck fracture (HCC) 07/07/2021  ? Essential hypertension 07/07/2021  ? Anxiety 07/07/2021  ? Depression 07/07/2021  ? Hyperlipidemia 12/19/2014  ? ? ?Orientation RESPIRATION BLADDER Height & Weight   ?  ?Self, Time, Situation, Place ? Normal Continent Weight: 122 lb (55.3 kg) ?Height:  5\' 4"  (162.6 cm)  ?BEHAVIORAL SYMPTOMS/MOOD NEUROLOGICAL BOWEL NUTRITION STATUS  ?    Continent Diet (Regular diet)  ?AMBULATORY STATUS COMMUNICATION OF NEEDS Skin   ?Limited Assist Verbally Surgical wounds ?  ?  ?  ?    ?     ?     ? ? ?Personal Care Assistance Level of Assistance  ?Bathing, Feeding, Dressing Bathing Assistance: Limited assistance ?Feeding assistance: Independent ?Dressing Assistance: Limited assistance ?   ? ?Functional Limitations Info  ?Sight, Speech, Hearing Sight Info: Adequate ?Hearing Info: Adequate ?Speech Info: Adequate  ? ? ?SPECIAL CARE FACTORS FREQUENCY  ?    ?  ?  ?  ?  ?  ?  ?   ? ? ?Contractures Contractures Info: Not present  ? ? ?Additional Factors Info  ?Code Status, Allergies, Psychotropic Code Status Info:  Full Code ?Allergies Info: Jessica Anthony   Jessica Anthony ?Psychotropic Info: sertraline (ZOLOFT) tablet 50 mg ?  ?  ?   ? ?Current Medications (11/01/2021):  This is the current hospital active medication list ?Current Facility-Administered Medications  ?Medication Dose Route Frequency Provider Last Rate Last Admin  ? acetaminophen (TYLENOL) tablet 650 mg  650 mg Oral Q6H PRN 11/03/2021, MD   650 mg at 10/31/21 2102  ? Or  ? acetaminophen (TYLENOL) suppository 650 mg  650 mg Rectal Q6H PRN 2103, MD      ? amLODipine (NORVASC) tablet 5 mg  5 mg Oral Daily Netta Cedars, MD   5 mg at 11/01/21 0945  ? bisacodyl (DULCOLAX) suppository 10 mg  10 mg Rectal Daily PRN 11/03/21, MD      ? enoxaparin (LOVENOX) injection 40 mg  40 mg Subcutaneous Q24H Netta Cedars, MD   40 mg at 10/31/21 2101  ? fenofibrate tablet 54 mg  54 mg Oral Daily 2102, MD   54 mg at 11/01/21 0945  ? LORazepam (ATIVAN) tablet 1 mg  1 mg Oral BID PRN 11/03/21, MD   1 mg at 10/31/21 2101  ? methocarbamol (ROBAXIN) 500 mg in dextrose 5 % 50 mL IVPB  500 mg Intravenous Q6H PRN 2102, MD      ? morphine (PF) 2 MG/ML injection 1 mg  1 mg Intravenous Q4H PRN Netta Cedars, MD      ?  ondansetron (ZOFRAN) tablet 4 mg  4 mg Oral Q6H PRN Netta Cedars, MD      ? Or  ? ondansetron (ZOFRAN) injection 4 mg  4 mg Intravenous Q6H PRN Netta Cedars, MD      ? oxyCODONE (Oxy IR/ROXICODONE) immediate release tablet 5 mg  5 mg Oral Q4H PRN Netta Cedars, MD   5 mg at 11/01/21 0945  ? polyethylene glycol (MIRALAX / GLYCOLAX) packet 17 g  17 g Oral Daily Netta Cedars, MD   17 g at 11/01/21 0944  ? polyethylene glycol (MIRALAX / GLYCOLAX) packet 17 g  17 g Oral Daily PRN Rolly Salter, MD      ? pravastatin (PRAVACHOL) tablet 40 mg  40 mg Oral Daily Rolly Salter, MD   40 mg at 11/01/21 0944  ? senna-docusate (Senokot-S) tablet 1 tablet  1 tablet Oral BID Netta Cedars, MD   1  tablet at 11/01/21 0945  ? sertraline (ZOLOFT) tablet 50 mg  50 mg Oral Daily Rolly Salter, MD   50 mg at 11/01/21 0945  ? sodium phosphate (FLEET) 7-19 GM/118ML enema 1 enema  1 enema Rectal Once PRN Netta Cedars, MD      ? ? ? ?Discharge Medications: ?Please see discharge summary for a list of discharge medications. ? ?Relevant Imaging Results: ? ?Relevant Lab Results: ? ? ?Additional Information ?SSN [id number] ? ?Jessica Lienhard, LCSW ? ? ? ? ?

## 2021-11-01 ENCOUNTER — Other Ambulatory Visit (HOSPITAL_COMMUNITY): Payer: Self-pay

## 2021-11-01 DIAGNOSIS — S82891A Other fracture of right lower leg, initial encounter for closed fracture: Secondary | ICD-10-CM | POA: Diagnosis not present

## 2021-11-01 LAB — BASIC METABOLIC PANEL
Anion gap: 9 (ref 5–15)
BUN: 20 mg/dL (ref 8–23)
CO2: 26 mmol/L (ref 22–32)
Calcium: 9 mg/dL (ref 8.9–10.3)
Chloride: 104 mmol/L (ref 98–111)
Creatinine, Ser: 0.65 mg/dL (ref 0.44–1.00)
GFR, Estimated: >60 mL/min (ref >60)
Glucose, Bld: 99 mg/dL (ref 70–99)
Potassium: 3.4 mmol/L — ABNORMAL LOW (ref 3.5–5.1)
Sodium: 139 mmol/L (ref 135–145)

## 2021-11-01 LAB — CBC
HCT: 39.5 % (ref 36.0–46.0)
Hemoglobin: 12.8 g/dL (ref 12.0–15.0)
MCH: 28.4 pg (ref 26.0–34.0)
MCHC: 32.4 g/dL (ref 30.0–36.0)
MCV: 87.6 fL (ref 80.0–100.0)
Platelets: 260 10*3/uL (ref 150–400)
RBC: 4.51 MIL/uL (ref 3.87–5.11)
RDW: 14 % (ref 11.5–15.5)
WBC: 8.3 10*3/uL (ref 4.0–10.5)
nRBC: 0 % (ref 0.0–0.2)

## 2021-11-01 MED ORDER — POTASSIUM CHLORIDE CRYS ER 20 MEQ PO TBCR
40.0000 meq | EXTENDED_RELEASE_TABLET | Freq: Once | ORAL | Status: AC
  Administered 2021-11-01: 40 meq via ORAL
  Filled 2021-11-01: qty 2

## 2021-11-01 NOTE — Plan of Care (Signed)
Problem: Education: ?Goal: Knowledge of General Education information will improve ?Description: Including pain rating scale, medication(s)/side effects and non-pharmacologic comfort measures ?Outcome: Progressing ?  ?Problem: Health Behavior/Discharge Planning: ?Goal: Ability to manage health-related needs will improve ?Outcome: Progressing ?  ?Problem: Clinical Measurements: ?Goal: Ability to maintain clinical measurements within normal limits will improve ?Outcome: Progressing ?  ?Haydee Salter, RN ?11/01/21 ?3:02 PM ? ?

## 2021-11-01 NOTE — Care Management Important Message (Signed)
Important Message ? ?Patient Details IM Letter given to the Patient. ?Name: Jessica Anthony ?MRN: [id number] ?Date of Birth: [birth year] ? ? ?Medicare Important Message Given:  Yes ? ? ? ? ?Kerin Salen ?11/01/2021, 2:58 PM ?

## 2021-11-01 NOTE — Progress Notes (Signed)
[facility] ?PROGRESS NOTE ? ?Patient: Jessica Anthony [id number]   ?PCP: Carol Ada, MD DOB: 12/13/1945   ?DOA: 10/27/2021   DOS: 11/01/2021   ? ?Subjective: No acute complaints.  No acute events. ? ?Objective:  ?Vitals:  ? 11/01/21 0553 11/01/21 1329  ?BP: (!) 146/82 131/79  ?Pulse: 79 77  ?Resp: 18 14  ?Temp: 97.9 ?F (36.6 ?C) 98.1 ?F (36.7 ?C)  ?SpO2: 99% 97%  ?  ?S1-S2 present. ?Clear to auscultation. ?Bowel sound present ? ?Assessment and plan: ?Right trimalleolar ankle fracture. ?Medically stable for discharge to SNF.  SNF can take the patient tomorrow. ? ?Author: ?Berle Mull, MD ?[facility] ?11/01/2021 4:55 PM   ?If 7PM-7AM, please contact night-coverage at [email or web address]  ?

## 2021-11-01 NOTE — TOC Progression Note (Signed)
Transition of Care (TOC) - Progression Note  ? ? ?Patient Details  ?Name: Koreen Lizaola ?MRN: [id number] ?Date of Birth: Nov 03, 1945 ? ?Transition of Care (TOC) CM/SW Contact  ?Navdeep Fessenden, LCSW ?Phone Number: ?11/01/2021, 3:48 PM ? ?Clinical Narrative:    ?Have reviewed SNF bed offers with pt and friend, Jonny Ruiz and they have accepted bed at [facility] who can admit patient tomorrow.  Have completed insurance authorization [employer] # [id number]).  Facility requests covid test and MD aware.  Plan to transfer to facility tomorrow via [facility]. ? ? ?Expected Discharge Plan: Skilled Nursing Facility (vs. HH) ?Barriers to Discharge: Continued Medical Work up ? ?Expected Discharge Plan and Services ?Expected Discharge Plan: Skilled Nursing Facility (vs. HH) ?In-house Referral: Clinical Social Work ?  ?  ?Living arrangements for the past 2 months: Single Family Home ?                ?  ?  ?  ?  ?  ?  ?  ?  ?  ?  ? ? ?Social Determinants of Health (SDOH) Interventions ?  ? ?Readmission Risk Interventions ? ?  10/29/2021  ?  3:48 PM  ?Readmission Risk Prevention Plan  ?Post Dischage Appt Complete  ?Medication Screening Complete  ?Transportation Screening Complete  ? ? ?

## 2021-11-01 NOTE — Plan of Care (Signed)
  Problem: Coping: Goal: Level of anxiety will decrease Outcome: Progressing   Problem: Pain Managment: Goal: General experience of comfort will improve Outcome: Progressing   Problem: Safety: Goal: Ability to remain free from injury will improve Outcome: Progressing   

## 2021-11-01 NOTE — Progress Notes (Signed)
Transition of Care (TOC) -30 day Note   ?  ?  ?Patient Details  ?Name: Jessica Anthony  ?MRN:  [id number] ?Date of Birth: Sep 11, 1945 ?  ?Transition of Care (TOC) CM/SW Contact  ?Name:  Lennart Pall, LCSW ?Phone Number:  [phone] ?Date: 11/01/21 ?Time:  09:58 am ?  ?MUST ID: [id number] ?  ?To Whom it May Concern: ?  ?Please be advised that the above patient will require a short-term nursing home stay, anticipated 30 days or less rehabilitation and strengthening. The plan is for return home.  ?   ?

## 2021-11-02 ENCOUNTER — Encounter (HOSPITAL_COMMUNITY): Payer: Self-pay | Admitting: Orthopaedic Surgery

## 2021-11-02 DIAGNOSIS — Z9181 History of falling: Secondary | ICD-10-CM | POA: Diagnosis not present

## 2021-11-02 DIAGNOSIS — S301XXA Contusion of abdominal wall, initial encounter: Secondary | ICD-10-CM | POA: Diagnosis not present

## 2021-11-02 DIAGNOSIS — F32A Depression, unspecified: Secondary | ICD-10-CM | POA: Diagnosis not present

## 2021-11-02 DIAGNOSIS — S82851A Displaced trimalleolar fracture of right lower leg, initial encounter for closed fracture: Secondary | ICD-10-CM | POA: Diagnosis not present

## 2021-11-02 DIAGNOSIS — Z7401 Bed confinement status: Secondary | ICD-10-CM | POA: Diagnosis not present

## 2021-11-02 DIAGNOSIS — I10 Essential (primary) hypertension: Secondary | ICD-10-CM | POA: Diagnosis not present

## 2021-11-02 DIAGNOSIS — R52 Pain, unspecified: Secondary | ICD-10-CM | POA: Diagnosis not present

## 2021-11-02 DIAGNOSIS — E876 Hypokalemia: Secondary | ICD-10-CM | POA: Diagnosis not present

## 2021-11-02 DIAGNOSIS — S82891D Other fracture of right lower leg, subsequent encounter for closed fracture with routine healing: Secondary | ICD-10-CM | POA: Diagnosis not present

## 2021-11-02 DIAGNOSIS — K5900 Constipation, unspecified: Secondary | ICD-10-CM | POA: Diagnosis not present

## 2021-11-02 DIAGNOSIS — S82851D Displaced trimalleolar fracture of right lower leg, subsequent encounter for closed fracture with routine healing: Secondary | ICD-10-CM | POA: Diagnosis not present

## 2021-11-02 DIAGNOSIS — R531 Weakness: Secondary | ICD-10-CM | POA: Diagnosis not present

## 2021-11-02 DIAGNOSIS — E785 Hyperlipidemia, unspecified: Secondary | ICD-10-CM | POA: Diagnosis not present

## 2021-11-02 DIAGNOSIS — S72009A Fracture of unspecified part of neck of unspecified femur, initial encounter for closed fracture: Secondary | ICD-10-CM | POA: Diagnosis not present

## 2021-11-02 DIAGNOSIS — R2689 Other abnormalities of gait and mobility: Secondary | ICD-10-CM | POA: Diagnosis not present

## 2021-11-02 DIAGNOSIS — M6281 Muscle weakness (generalized): Secondary | ICD-10-CM | POA: Diagnosis not present

## 2021-11-02 DIAGNOSIS — Z743 Need for continuous supervision: Secondary | ICD-10-CM | POA: Diagnosis not present

## 2021-11-02 LAB — RESP PANEL BY RT-PCR (FLU A&B, COVID) ARPGX2
Influenza A by PCR: NEGATIVE
Influenza B by PCR: NEGATIVE
SARS Coronavirus 2 by RT PCR: NEGATIVE

## 2021-11-02 NOTE — Plan of Care (Signed)
Report called to Anne at [facility]. 

## 2021-11-02 NOTE — Plan of Care (Signed)
  Problem: Health Behavior/Discharge Planning: Goal: Ability to manage health-related needs will improve Outcome: Progressing   Problem: Activity: Goal: Risk for activity intolerance will decrease Outcome: Progressing   Problem: Pain Managment: Goal: General experience of comfort will improve Outcome: Progressing   Problem: Safety: Goal: Ability to remain free from injury will improve Outcome: Progressing   

## 2021-11-02 NOTE — Discharge Summary (Addendum)
?Physician Discharge Summary ?  ?Patient: Jessica Anthony MRN: [id number] DOB: 06-Feb-1946  ?Admit date:     10/27/2021  ?Discharge date: 11/02/21  ?Discharge Physician: Lynden Oxford  ?PCP: Merri Brunette, MD ? ?Recommendations at discharge: ?Follow-up with orthopedics in 7 to 10 days. ?sutures out in 2-3 weeks with exchange of short leg splint to short leg cast in outpatient office ?strict NWB operative extremity, maximum elevation ? ?Discharge Diagnoses: ?Principal Problem: ?  Trimalleolar fracture of ankle, closed, right, initial encounter ?Active Problems: ?  Essential hypertension ?  Anxiety ?  Depression ?  Hypokalemia ? ?Hospital Course: ?76 year old female with past medical history of HTN, HLD, depression presents with complaints of mechanical fall and found to have trimalleolar ankle fracture. ?3/25 underwent surgery. ? ?Assessment and Plan: ?* Trimalleolar fracture of ankle, closed, right, initial encounter ?Right trimalleolar ankle fracture s/p mechanical fall ?-CT right ankle showed trimalleolar fracture with widening of the medial morsel and partial posterior tibial subluxation. Likely has unstable ankle.   ?3/25 underwent surgery, open reduction internal fixation.  Patient surgically ready for discharge to SNF.  Nonweightbearing on the affected surgical right limb. ?Pain medication and DVT prophylaxis per orthopedics, who has recommended Lovenox 40 mg subcu for 3 weeks and aspirin 325 mg twice daily for another 3 weeks. ? ?Hypokalemia ?Replaced.   ? ?Anxiety ?Depression ?Mood appears to be stable.  Somewhat forgetful intermittently postoperatively. ?On Zoloft and Ativan.  Continue Zoloft.  Patient is on scheduled Ativan which is switch to as needed. ? ?Essential hypertension ?Blood pressure stable. ?On losartan HCTZ and Norvasc at home. ?I do not think the patient requires 3 medication regimen for now. ?Will initiate Norvasc. ? ?Pain control - [employer] Controlled Substance Reporting System database  was reviewed. and patient was instructed, not to drive, operate heavy machinery, perform activities at heights, swimming or participation in water activities or provide baby-sitting services while on Pain, Sleep and Anxiety Medications; until their outpatient Physician has advised to do so again. Also recommended to not to take more than prescribed Pain, Sleep and Anxiety Medications.  ? ?Consultants: Orthopedics  ?Procedures performed:  ?Right trimalleolar ankle fracture open reduction internal fixation with fixation of posterior lip ?Right ankle syndesmosis open reduction internal fixation ? ?DISCHARGE MEDICATION: ?Allergies as of 11/02/2021   ? ?   Reactions  ? Amoxicillin Other (See Comments)  ? Anxious feeling  ? Statins   ? Muscle aches with all Statins  ? ?  ? ?  ?Medication List  ?  ? ?STOP taking these medications   ? ?hydrochlorothiazide 25 MG tablet ?Commonly known as: HYDRODIURIL ?  ?losartan 100 MG tablet ?Commonly known as: COZAAR ?  ? ?  ? ?TAKE these medications   ? ?amLODipine 5 MG tablet ?Commonly known as: NORVASC ?Take 5 mg by mouth daily. ?  ?aspirin EC 325 MG tablet ?Take 1 tablet (325 mg total) by mouth in the morning and at bedtime for 21 days. ?Start taking on: November 21, 2021 ?  ?CALCIUM-MAGNESUIUM-ZINC 333-133-8.3 MG Tabs ?Take 1 tablet by mouth daily. ?  ?docusate sodium 100 MG capsule ?Commonly known as: Colace ?Take 1 capsule (100 mg total) by mouth 2 (two) times daily. ?  ?enoxaparin 40 MG/0.4ML injection ?Commonly known as: LOVENOX ?Inject 0.4 mLs (40 mg total) into the skin daily for 20 days. ?  ?fenofibrate 54 MG tablet ?Take 54 mg by mouth daily. ?  ?folic acid 400 MCG tablet ?Commonly known as: FOLVITE ?Take 400 mcg by mouth 3 (  three) times a week. No specific days ?  ?LORazepam 1 MG tablet ?Commonly known as: ATIVAN ?Take 1 tablet (1 mg total) by mouth 2 (two) times daily as needed for anxiety. ?What changed:  ?when to take this ?reasons to take this ?  ?ondansetron 4 MG  tablet ?Commonly known as: ZOFRAN ?Take 1 tablet (4 mg total) by mouth every 6 (six) hours as needed for nausea. ?  ?oxyCODONE 5 MG immediate release tablet ?Commonly known as: Oxy IR/ROXICODONE ?Take 1 tablet (5 mg total) by mouth every 6 (six) hours as needed for up to 7 days for severe pain. ?  ?polyethylene glycol 17 g packet ?Commonly known as: MIRALAX / GLYCOLAX ?Take 17 g by mouth daily as needed for mild constipation. ?  ?polyvinyl alcohol 1.4 % ophthalmic solution ?Commonly known as: LIQUIFILM TEARS ?Place 1 drop into both eyes as needed for dry eyes. ?  ?pravastatin 40 MG tablet ?Commonly known as: PRAVACHOL ?Take 40 mg by mouth daily. ?  ?sertraline 50 MG tablet ?Commonly known as: ZOLOFT ?Take 50 mg by mouth daily. ?  ?triamcinolone cream 0.1 % ?Commonly known as: KENALOG ?Apply 1 application topically 2 (two) times daily as needed for itching. ?  ?VITAMIN D PO ?Take 1,000 mcg by mouth daily. ?  ? ?  ? ? Contact information for follow-up providers   ? ? Merri Brunette, MD. Schedule an appointment as soon as possible for a visit in 1 week(s).   ?Specialty: Family Medicine ?Contact information: ?[address] ?[address] ?[place] [address] ?[phone] ? ? ?  ?  ? ? Netta Cedars, MD. Schedule an appointment as soon as possible for a visit in 1 week(s).   ?Specialty: Orthopedic Surgery ?Why: For wound re-check ?Contact information: ?[address] ?[place] [address] ?[phone] ? ? ?  ?  ? ?  ?  ? ? Contact information for after-discharge care   ? ? Destination   ? ? [facility] .   ?Service: Skilled Nursing ?Contact information: ?[address] ?[place] [address] ?[phone] ? ?  ?  ? ?  ?  ? ?  ?  ? ?  ? ?Disposition: Skilled nursing facility ?Diet recommendation:  ?Discharge Exam: ?Filed Weights  ? 10/27/21 2015  ?Weight: 55.3 kg  ? ?General: Appear in no distress; no visible Abnormal Neck Mass Or lumps, Conjunctiva  normal ?Cardiovascular: S1 and S2 Present, no Murmur, ?Respiratory: good respiratory effort, Bilateral Air entry present and CTA, no Crackles, no wheezes ?Abdomen: Bowel Sound present Non tender ?Extremities: no Pedal edema ?Neurology: alert and oriented to time, place, and person ?Gait not checked due to patient safety concerns  ? ?Condition at discharge: good ? ?The results of significant diagnostics from this hospitalization (including imaging, microbiology, ancillary and laboratory) are listed below for reference.  ? ?Imaging Studies: ?DG Pelvis 1-2 Views ? ?Result Date: 10/27/2021 ?CLINICAL DATA:  Fall EXAM: PELVIS - 1-2 VIEW COMPARISON:  07/08/2021 FINDINGS: There is no evidence of pelvic fracture or diastasis. No pelvic bone lesions are seen. 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 IMPRESSION: Negative. Electronically Signed   By: Deatra Robinson M.D.   On: 10/27/2021 23:09  ? ?DG Ankle Complete Right ? ?Result Date: 10/30/2021 ?CLINICAL DATA:  ORIF right ankle fractures. EXAM: OPERATIVE RIGHT ANKLE (WITH PELVIS IF PERFORMED) 6 VIEWS TECHNIQUE: Fluoroscopic spot image(s) were submitted for interpretation post-operatively. COMPARISON:  10/27/2021 FINDINGS: Submitted portable images demonstrate placement of a lateral fixation  plate across the distal fibula reducing the fibular fracture into near anatomic alignment. A posterior fusion plate lies along the posterior margin of the distal tibia reducing the posterior malleolar fracture into near anatomic alignment. The talus is now normally aligned with the tibia. Orthopedic hardware appears well seated. IMPRESSION: Well-aligned right ankle fractures following ORIF. Electronically Signed   By: Amie Portlandavid  Ormond M.D.   On: 10/30/2021 10:39  ? ?DG Ankle Complete Right ? ?Result Date: 10/27/2021 ?CLINICAL DATA:  Ankle pain. EXAM: RIGHT ANKLE - COMPLETE 3+ VIEW COMPARISON:  None. FINDINGS: There is an acute comminuted fracture of the distal fibular diaphysis above  the level of the ankle mortise. There is apex anterior angulation. There is an acute vertically oriented fracture through the posterior malleolus, minimally displaced. There is an acute transverse comminuted fracture through the med

## 2021-11-02 NOTE — TOC Transition Note (Signed)
Transition of Care (TOC) - CM/SW Discharge Note ? ? ?Patient Details  ?Name: Jessica Anthony ?MRN: [id number] ?Date of Birth: 15-Nov-1945 ? ?Transition of Care (TOC) CM/SW Contact:  ?Pravin Perezperez, LCSW ?Phone Number: ?11/02/2021, 11:43 AM ? ? ?Clinical Narrative:    ?Pt cleared for dc today to SNF at [facility].  Pt and family agree.  PTAR called at 11:40am.  RN to call report to [phone].  No further TOC needs. ? ?Final next level of care: Skilled Nursing Facility ?Barriers to Discharge: Barriers Resolved ? ? ?Patient Goals and CMS Choice ?Patient states their goals for this hospitalization and ongoing recovery are:: to be home ?  ?  ? ?Discharge Placement ?PASRR number recieved: 11/01/21 ?           ?Patient chooses bed at: [facility] ?Patient to be transferred to facility by: [facility]AR ?Name of family member notified: friend, Jonny Ruiz ?Patient and family notified of of transfer: 11/02/21 ? ?Discharge Plan and Services ?In-house Referral: Clinical Social Work ?  ?           ?DME Arranged: N/A ?DME Agency: NA ?  ?  ?  ?  ?  ?  ?  ?  ? ?Social Determinants of Health (SDOH) Interventions ?  ? ? ?Readmission Risk Interventions ? ?  10/29/2021  ?  3:48 PM  ?Readmission Risk Prevention Plan  ?Post Dischage Appt Complete  ?Medication Screening Complete  ?Transportation Screening Complete  ? ? ? ? ? ?

## 2021-11-03 DIAGNOSIS — R52 Pain, unspecified: Secondary | ICD-10-CM | POA: Diagnosis not present

## 2021-11-08 ENCOUNTER — Other Ambulatory Visit: Payer: Self-pay | Admitting: *Deleted

## 2021-11-08 NOTE — Patient Outreach (Signed)
Per [facility] eligible member currently resides in [facility] SNF.  Screening for potential [facility] Care Management services as a benefit of member's insurance plan. ? ?Ms. Benedetti admitted to SNF on 11/02/21 after hospitalization. ? ?Secure communication sent to facility SW and Admissions Coordinator to make aware writer is following for transition plans and [facility] needs. ? ?Will continue to follow while member resides in SNF. ? ? ? ?Raiford Noble, MSN, RN,BSN ?[facility] Post Acute Care Coordinator ?[phone] [facility]) ?[phone]  (Toll free office)   ?

## 2021-11-09 ENCOUNTER — Other Ambulatory Visit: Payer: Self-pay | Admitting: *Deleted

## 2021-11-09 NOTE — Patient Outreach (Signed)
[facility] Post- Acute Care Coordinator follow up. Per [place] eligible member currently resides in [facility] SN[facility].  Screening for potential [facility] Care Management services as a benefit of member's insurance plan. ? ?Facility site visit to [facility] skilled nursing facility. Met with Jessica Anthony to discuss transition plans and Jessica Anthony Management services. Jessica Anthony endorses she has been living with her friend Jessica Anthony. States Jessica Anthony assists her with everything including meals and transportation. Jessica Anthony reports she will go home with Jessica Anthony post SNF.  ? ?Explained [facility] Care Management services. Jessica Anthony is agreeable. Explained [facility] Care Management will not interfere or replace services provided by home health.  ? ?Jessica Anthony states she is looking forward to returning home post SNF. States she has ortho appt follow up this Friday. She is hopeful she will be placed in a boot. ? ?Confirmed PCP is Dr. Tamala Julian with [facility]. ?Confirmed best contact number for Jessica Anthony is her friend's Jessica Anthony number at [phone]. ? ?Provided [facility] Care Management brochure, 24-hr nurse advice line magnet, and writer's contact information.  ? ?Will plan to make referral for Jessica Anthony for care coordination upon SNF discharge.  ? ?Will continue to follow while member resides in SNF.  ? ? ?[name], MSN, RN,BSN ?[place] Coordinator ?[phone] [facility]) ?[phone]  (Toll free office)   ? ? ? ? ?  ?

## 2021-11-12 DIAGNOSIS — S82851D Displaced trimalleolar fracture of right lower leg, subsequent encounter for closed fracture with routine healing: Secondary | ICD-10-CM | POA: Diagnosis not present

## 2021-11-16 DIAGNOSIS — M6281 Muscle weakness (generalized): Secondary | ICD-10-CM | POA: Diagnosis not present

## 2021-11-16 DIAGNOSIS — S301XXA Contusion of abdominal wall, initial encounter: Secondary | ICD-10-CM | POA: Diagnosis not present

## 2021-11-16 DIAGNOSIS — Z9181 History of falling: Secondary | ICD-10-CM | POA: Diagnosis not present

## 2021-11-16 DIAGNOSIS — R2689 Other abnormalities of gait and mobility: Secondary | ICD-10-CM | POA: Diagnosis not present

## 2021-11-16 DIAGNOSIS — R52 Pain, unspecified: Secondary | ICD-10-CM | POA: Diagnosis not present

## 2021-11-19 DIAGNOSIS — S82851D Displaced trimalleolar fracture of right lower leg, subsequent encounter for closed fracture with routine healing: Secondary | ICD-10-CM | POA: Diagnosis not present

## 2021-11-23 ENCOUNTER — Other Ambulatory Visit: Payer: Self-pay | Admitting: *Deleted

## 2021-11-23 NOTE — Patient Outreach (Signed)
[facility] Post- Acute Care Coordinator follow up. Per [facility] eligible member currently resides in [facility] SN[facility].   ? ?Secure communication sent to [facility] SW and Admissions Coordinator to inquire about transition plans/date. ? ?Will continue to follow.  ? ? ? ?Raiford Noble, MSN, RN,BSN ?[facility] Post Acute Care Coordinator ?[phone] [facility]) ?[phone]  (Toll free office)   ?

## 2021-11-25 ENCOUNTER — Other Ambulatory Visit: Payer: Self-pay | Admitting: *Deleted

## 2021-11-25 NOTE — Patient Outreach (Signed)
[facility] Post- Acute Care Coordinator follow up. Per [facility] eligible member currently resides in [facility] SNF.  Screened for potential [facility] Care Management services as a benefit of member's insurance plan. ? ?Update received from [facility], Admissions Coordinator indicating Ms. Lippold will transition to home on Friday, 11/26/21 with home health services. Writer previously spoke with Ms. Campus on 11/09/21. Mrs. Woodbury was agreeable to [facility] Care Management services at that time.   ? ?Ms. Yazzie previously confirmed best contact number post SNF is [phone]. Transition plan to return home with her friend Jonny Ruiz.  ? ?Will confirm transition date in [facility] and will make [facility] Care Management RNCM referral for care coordination post SNF.  ? ?Raiford Noble, MSN, RN,BSN ?[facility] Post Acute Care Coordinator ?[phone] [facility]) ?[phone]  (Toll free office)  ? ?

## 2021-11-26 DIAGNOSIS — S82851D Displaced trimalleolar fracture of right lower leg, subsequent encounter for closed fracture with routine healing: Secondary | ICD-10-CM | POA: Diagnosis not present

## 2021-11-30 ENCOUNTER — Other Ambulatory Visit: Payer: Self-pay | Admitting: *Deleted

## 2021-11-30 NOTE — Patient Outreach (Signed)
[facility] Post-Acute Care Coordinator follow up. Verified with [facility] SNF staff Ms. Pongratz transitioned home. ? ?Writer informed Ms. Kenton's PCP has [facility] care management services available if needed. Therefore, [facility] Care Management will not follow after all.  ? ?Telephone call made to Ms. Hustead on friend's Jonny Ruiz number [phone] (member previously advised to call John's number). Spoke with Jonny Ruiz who report Ms. Kleven is currently on the phone. John states Ms. Skidmore has a new phone and the new telephone number is [phone]. ? ?Will send notification to [facility] care management. ? ? ?Raiford Noble, MSN, RN,BSN ?[facility] Post Acute Care Coordinator ?[phone] [facility]) ?[phone]  (Toll free office)  ?

## 2021-12-14 DIAGNOSIS — S82851D Displaced trimalleolar fracture of right lower leg, subsequent encounter for closed fracture with routine healing: Secondary | ICD-10-CM | POA: Diagnosis not present

## 2021-12-19 DIAGNOSIS — S82851D Displaced trimalleolar fracture of right lower leg, subsequent encounter for closed fracture with routine healing: Secondary | ICD-10-CM | POA: Diagnosis not present

## 2021-12-30 DIAGNOSIS — M79671 Pain in right foot: Secondary | ICD-10-CM | POA: Diagnosis not present

## 2022-01-04 DIAGNOSIS — M25671 Stiffness of right ankle, not elsewhere classified: Secondary | ICD-10-CM | POA: Diagnosis not present

## 2022-01-04 DIAGNOSIS — M25571 Pain in right ankle and joints of right foot: Secondary | ICD-10-CM | POA: Diagnosis not present

## 2022-01-12 ENCOUNTER — Ambulatory Visit: Payer: Medicare Other | Attending: Orthopaedic Surgery

## 2022-01-12 DIAGNOSIS — R2689 Other abnormalities of gait and mobility: Secondary | ICD-10-CM | POA: Diagnosis not present

## 2022-01-12 DIAGNOSIS — M6281 Muscle weakness (generalized): Secondary | ICD-10-CM | POA: Insufficient documentation

## 2022-01-12 DIAGNOSIS — R2681 Unsteadiness on feet: Secondary | ICD-10-CM | POA: Diagnosis not present

## 2022-01-12 NOTE — Therapy (Signed)
OUTPATIENT PHYSICAL THERAPY LOWER EXTREMITY EVALUATION   Patient Name: Jessica Anthony MRN: [id number] DOB:11-04-1945, 76 y.o., female Today's Date: 01/12/2022   PT End of Session - 01/12/22 1229     Visit Number 1    Number of Visits 20    Date for PT Re-Evaluation 03/23/22    Authorization Type [employer] Medicare    PT Start Time 1045    PT Stop Time 1128    PT Time Calculation (min) 43 min    Activity Tolerance Patient tolerated treatment well    Behavior During Therapy WFL for tasks assessed/performed             Past Medical History:  Diagnosis Date   Detached retina    Hyperlipemia    Hypertension    Past Surgical History:  Procedure Laterality Date   ORIF ANKLE FRACTURE Right 10/30/2021   Procedure: RIGHT TRIMALLEOLAR OPEN REDUCTION INTERNAL FIXATION (ORIF) ANKLE FRACTURE,;  Surgeon: Armond Hang, MD;  Location: [facility];  Service: Orthopedics;  Laterality: Right;   TOTAL HIP ARTHROPLASTY Right 07/08/2021   Procedure: TOTAL HIP ARTHROPLASTY ANTERIOR APPROACH;  Surgeon: Rod Can, MD;  Location: [facility];  Service: Orthopedics;  Laterality: Right;   Patient Active Problem List   Diagnosis Date Noted   Hypokalemia 10/28/2021   Trimalleolar fracture of ankle, closed, right, initial encounter 10/28/2021   Femoral neck fracture ([place]) 07/07/2021   Essential hypertension 07/07/2021   Anxiety 07/07/2021   Depression 07/07/2021   Hyperlipidemia 12/19/2014    PCP: Carol Ada, MD  REFERRING PROVIDER: Armond Hang, MD  REFERRING DIAG:  closed trimalleolar fracture of right ankle  THERAPY DIAG:  Muscle weakness (generalized) - Plan: PT plan of care cert/re-cert  Unsteadiness on feet - Plan: PT plan of care cert/re-cert  Other abnormalities of gait and mobility - Plan: PT plan of care cert/re-cert  Rationale for Evaluation and Treatment Rehabilitation  ONSET DATE: 10/27/2021  SUBJECTIVE:   SUBJECTIVE STATEMENT: Pt presents to PT s/p fall and R  ankle fx with ORIF on 10/27/2021. She was discharged from acute to SNF and later home, notes she has been doing well and is moving more. She is WBAT now in CAM boot per MD office and has been ambulating with FWW.   PERTINENT HISTORY: HTN, Hx of R anterior THA  PAIN:  Are you having pain?  No: NPRS scale: 0/10 (1/10) Pain location: R ankle Pain description: tight Aggravating factors: prolonged standing, walking Relieving factors: rest  PRECAUTIONS: None  WEIGHT BEARING RESTRICTIONS Yes WBAT in CAM boot  FALLS:  Has patient fallen in last 6 months? Yes. Number of falls - two; fall in Dec 2022 leading to [place] and recent fall in March 2023 leading to R ankle ORIF  LIVING ENVIRONMENT: Lives with: lives with their family Lives in: House/apartment Stairs: Yes: External: 3 steps; on right going up Has following equipment at home: Single point cane, Walker - 2 wheeled, shower chair, and bed side commode  OCCUPATION: Retired  PLOF: Independent and Independent with basic ADLs  PATIENT GOALS: improve mobility and gait to no longer need a FWW and increase independence   OBJECTIVE:   DIAGNOSTIC FINDINGS:   See imaging   PATIENT SURVEYS:  FOTO 53% function; 69% predicted  COGNITION:  Overall cognitive status: Within functional limits for tasks assessed     SENSATION: WFL  POSTURE: rounded shoulders, forward head, and weight shift left  PALPATION: No TTP tnoted  LOWER EXTREMITY ROM:  AROM Right 01/12/2022 Left 01/12/2022  Hip flexion     Hip extension    Hip abduction    Hip adduction    Hip internal rotation    Hip external rotation    Knee flexion    Knee extension    Ankle dorsiflexion    Ankle plantarflexion    Ankle inversion    Ankle eversion     (Blank rows = not tested)  LOWER EXTREMITY MMT:    MMT Right 01/12/2022  Left 01/12/2022   Hip flexion     Hip extension    Hip abduction    Hip adduction    Hip external rotation    Hip internal rotation    Knee  extension    Knee flexion    Ankle dorsiflexion  3+/5   Ankle plantarflexion 3+/5   Ankle inversion 3+/5   Ankle eversion 3+/5   Grossly  5/5  (Blank rows = not tested)  LOWER EXTREMITY SPECIAL TESTS:  N/A  FUNCTIONAL TESTS:  Five Time Sit to Stand: 23 seconds TUG: 30 seconds in CAM with FWW  GAIT: Distance walked: 67ft Assistive device utilized: Environmental consultant - 2 wheeled and CAM boot on R foot Level of assistance: Modified independence Comments: step-to gait  TODAY'S TREATMENT: [facility] Adult PT Treatment:                                                DATE: 01/12/2022 Therapeutic Exercise: Calf stretch with towel x 30" R Seated heel raises x 10 Ankle inversion/eversion towel slide x 5 - Right Supine SLR x 5 - Right  PATIENT EDUCATION:  Education details: eval findings, FOTO, HEP, POC Person educated: Patient Education method: Explanation, Demonstration, and Handouts Education comprehension: verbalized understanding and returned demonstration   HOME EXERCISE PROGRAM: Access Code: [email or web address] URL: [email or web address] Date: 01/12/2022 Prepared by: Octavio Manns  Exercises - Long Sitting Calf Stretch with Strap  - 3 x daily - 7 x weekly - 2-3 reps - 30 seconds hold - Seated Heel Raise  - 3 x daily - 7 x weekly - 3 sets - 10 reps - Ankle Inversion Eversion Towel Slide  - 3 x daily - 7 x weekly - 3 sets - 10 reps - Supine Active Straight Leg Raise  - 3 x daily - 7 x weekly - 3 sets - 10 reps  ASSESSMENT:  CLINICAL IMPRESSION: Patient is a 76 y.o. F who was seen today for physical therapy evaluation and treatment s/p R ankle fx and ORIF. Phyiscal findings are consistent with injury and recovery timeline as pt demonstrates decreased functional mobility and balance assessed via TUG and Five Time Sit to Stand. Her FOTO demonstrates decreased functional ability with home ADLs and community activities, indicating she is operating below PLOF. Pt would benefit from skilled PT  services and will be seen and progressed as able.     OBJECTIVE IMPAIRMENTS decreased activity tolerance, decreased balance, decreased mobility, difficulty walking, decreased ROM, decreased strength, and pain.   ACTIVITY LIMITATIONS carrying, lifting, bending, standing, squatting, stairs, and transfers  PARTICIPATION LIMITATIONS: meal prep, cleaning, laundry, driving, community activity, and yard work  [place], Time since onset of injury/illness/exacerbation, and 1-2 comorbidities: HTN  are also affecting patient's functional outcome.   REHAB POTENTIAL: Excellent  CLINICAL DECISION MAKING: Stable/uncomplicated  EVALUATION COMPLEXITY: Low   GOALS: Goals reviewed with patient? No  SHORT TERM  GOALS: Target date: 02/02/2022  Pt will be compliant and knowledgeable with initial HEP for improved comfort and carryover Baseline: initial HEP given Goal status: INITIAL  2.  Pt will self report no right ankle pain for improved comfort and functional ability Baseline: 1/10 at worst Goal status: INITIAL  LONG TERM GOALS: Target date: 03/23/2022   Pt will improve FOTO function score to no less than 69% as proxy for functional improvement Baseline: 53% function Goal status: INITIAL  2.  Pt will improve R ankle DF to no less than 10 deg for improved functional mobility and gait Baseline: lacking 5 deg to neutral Goal status: INITIAL  3.  Pt will decrease Five Time Sit to Stand to no greater than 15 seconds for improved safety and functional mobility Baseline:  Goal status: INITIAL  4.  Pt will decrease TUG to no greater than 15 seconds with no AD for improved balance and mobility Baseline: 30 seconds in CAM with FWW Goal status: INITIAL  PLAN: PT FREQUENCY: 2x/week  PT DURATION: 10 weeks  PLANNED INTERVENTIONS: Therapeutic exercises, Therapeutic activity, Neuromuscular re-education, Balance training, Gait training, Patient/Family education, Joint mobilization,  Aquatic Therapy, Dry Needling, Electrical stimulation, Cryotherapy, Moist heat, Vasopneumatic device, Manual therapy, and Re-evaluation  PLAN FOR NEXT SESSION: assess HEP response, progress gait and LE strength/balance   Ward Chatters, PT 01/12/2022, 4:47 PM

## 2022-01-18 ENCOUNTER — Ambulatory Visit: Payer: Medicare Other

## 2022-01-18 DIAGNOSIS — M6281 Muscle weakness (generalized): Secondary | ICD-10-CM | POA: Diagnosis not present

## 2022-01-18 DIAGNOSIS — R2689 Other abnormalities of gait and mobility: Secondary | ICD-10-CM | POA: Diagnosis not present

## 2022-01-18 DIAGNOSIS — R2681 Unsteadiness on feet: Secondary | ICD-10-CM

## 2022-01-18 NOTE — Therapy (Signed)
OUTPATIENT PHYSICAL THERAPY TREATMENT NOTE   Patient Name: Jessica Anthony MRN: [id number] DOB:Jun 05, 1946, 76 y.o., female Today's Date: 01/18/2022  PCP: Merri Brunette, MD REFERRING PROVIDER: Netta Cedars, MD  END OF SESSION:   PT End of Session - 01/18/22 1431     Visit Number 2    Number of Visits 20    Date for PT Re-Evaluation 03/23/22    Authorization Type [employer] Medicare    PT Start Time 1435    PT Stop Time 1515    PT Time Calculation (min) 40 min    Activity Tolerance Patient tolerated treatment well    Behavior During Therapy WFL for tasks assessed/performed             Past Medical History:  Diagnosis Date   Detached retina    Hyperlipemia    Hypertension    Past Surgical History:  Procedure Laterality Date   ORIF ANKLE FRACTURE Right 10/30/2021   Procedure: RIGHT TRIMALLEOLAR OPEN REDUCTION INTERNAL FIXATION (ORIF) ANKLE FRACTURE,;  Surgeon: Netta Cedars, MD;  Location: [facility];  Service: Orthopedics;  Laterality: Right;   TOTAL HIP ARTHROPLASTY Right 07/08/2021   Procedure: TOTAL HIP ARTHROPLASTY ANTERIOR APPROACH;  Surgeon: Samson Frederic, MD;  Location: [facility];  Service: Orthopedics;  Laterality: Right;   Patient Active Problem List   Diagnosis Date Noted   Hypokalemia 10/28/2021   Trimalleolar fracture of ankle, closed, right, initial encounter 10/28/2021   Femoral neck fracture ([facility]) 07/07/2021   Essential hypertension 07/07/2021   Anxiety 07/07/2021   Depression 07/07/2021   Hyperlipidemia 12/19/2014    REFERRING DIAG: closed trimalleolar fracture of right ankle  THERAPY DIAG:  Muscle weakness (generalized)  Unsteadiness on feet  Other abnormalities of gait and mobility  Rationale for Evaluation and Treatment Rehabilitation  PERTINENT HISTORY: HTN, Hx of R anterior THA  PRECAUTIONS: None  ONSET DATE: 10/27/2021  SUBJECTIVE: Patient reports no pain today and HEP compliance. She states that she has a follow up appointment with  her doctor on 6/23,   PAIN:  Are you having pain?  No: NPRS scale: 0/10 (1/10) Pain location: R ankle Pain description: tight Aggravating factors: prolonged standing, walking Relieving factors: rest   OBJECTIVE: (objective measures completed at initial evaluation unless otherwise dated)   DIAGNOSTIC FINDINGS:            See imaging    PATIENT SURVEYS:  FOTO 53% function; 69% predicted   COGNITION:           Overall cognitive status: Within functional limits for tasks assessed                          SENSATION: WFL   POSTURE: rounded shoulders, forward head, and weight shift left   PALPATION: No TTP tnoted   LOWER EXTREMITY ROM:   AROM Right 01/12/2022 Left 01/12/2022  Hip flexion       Hip extension      Hip abduction      Hip adduction      Hip internal rotation      Hip external rotation      Knee flexion      Knee extension      Ankle dorsiflexion      Ankle plantarflexion      Ankle inversion      Ankle eversion       (Blank rows = not tested)   LOWER EXTREMITY MMT:     MMT Right 01/12/2022  Left  01/12/2022   Hip flexion       Hip extension      Hip abduction      Hip adduction      Hip external rotation      Hip internal rotation      Knee extension      Knee flexion      Ankle dorsiflexion  3+/5    Ankle plantarflexion 3+/5    Ankle inversion 3+/5    Ankle eversion 3+/5    Grossly   5/5  (Blank rows = not tested)   LOWER EXTREMITY SPECIAL TESTS:  N/A   FUNCTIONAL TESTS:  Five Time Sit to Stand: 23 seconds TUG: 30 seconds in CAM with FWW   GAIT: Distance walked: 34ft Assistive device utilized: Environmental consultant - 2 wheeled and CAM boot on R foot Level of assistance: Modified independence Comments: step-to gait   TODAY'S TREATMENT: [facility] Adult PT Treatment:                                                DATE: 01/18/2022 Therapeutic Exercise: Nustep level 5 x 5 mins LAQ Rt 3x10 Seated marching 3x10 BIL SLR 3x10 Rt Ankle 4 way RTB 2x10  each Seated heel raises 3 x 10  Seated toe raises 3x10 Ankle inversion/eversion towel slide x 2' - Right   [facility] Adult PT Treatment:                                                DATE: 01/12/2022 Therapeutic Exercise: Calf stretch with towel x 30" R Seated heel raises x 10 Ankle inversion/eversion towel slide x 5 - Right Supine SLR x 5 - Right   PATIENT EDUCATION:  Education details: eval findings, FOTO, HEP, POC Person educated: Patient Education method: Explanation, Demonstration, and Handouts Education comprehension: verbalized understanding and returned demonstration     HOME EXERCISE PROGRAM: Access Code: [email or web address] URL: [email or web address] Date: 01/18/2022 Prepared by: Harland German  Exercises - Long Sitting Calf Stretch with Strap  - 3 x daily - 7 x weekly - 2-3 reps - 30 seconds hold - Seated Heel Raise  - 3 x daily - 7 x weekly - 3 sets - 10 reps - Ankle Inversion Eversion Towel Slide  - 3 x daily - 7 x weekly - 3 sets - 10 reps - Supine Active Straight Leg Raise  - 3 x daily - 7 x weekly - 3 sets - 10 reps Added 01/18/2022 - Long Sitting Ankle Eversion with Resistance  - 1 x daily - 7 x weekly - 3 sets - 10 reps - Long Sitting Ankle Dorsiflexion with Anchored Resistance  - 1 x daily - 7 x weekly - 3 sets - 10 reps - Long Sitting Ankle Inversion with Resistance  - 1 x daily - 7 x weekly - 3 sets - 10 reps - Long Sitting Ankle Plantar Flexion with Resistance  - 1 x daily - 7 x weekly - 3 sets - 10 reps   ASSESSMENT:   CLINICAL IMPRESSION: Patient presents to PT with no current pain and reports HEP compliance. Session today focused on distal LE and ankle strengthening and mobility. Updated HEP to include ankle 4-way and patient given red band  to perform at home. Patient was able to tolerate all prescribed exercises with no adverse effects. Patient continues to benefit from skilled PT services and should be progressed as able to improve functional  independence.    OBJECTIVE IMPAIRMENTS decreased activity tolerance, decreased balance, decreased mobility, difficulty walking, decreased ROM, decreased strength, and pain.    ACTIVITY LIMITATIONS carrying, lifting, bending, standing, squatting, stairs, and transfers   PARTICIPATION LIMITATIONS: meal prep, cleaning, laundry, driving, community activity, and yard work   PERSONAL FACTORS [place], Time since onset of injury/illness/exacerbation, and 1-2 comorbidities: HTN  are also affecting patient's functional outcome.    REHAB POTENTIAL: Excellent   CLINICAL DECISION MAKING: Stable/uncomplicated   EVALUATION COMPLEXITY: Low     GOALS: Goals reviewed with patient? No   SHORT TERM GOALS: Target date: 02/02/2022  Pt will be compliant and knowledgeable with initial HEP for improved comfort and carryover Baseline: initial HEP given Goal status: INITIAL   2.  Pt will self report no right ankle pain for improved comfort and functional ability Baseline: 1/10 at worst Goal status: INITIAL   LONG TERM GOALS: Target date: 03/23/2022    Pt will improve FOTO function score to no less than 69% as proxy for functional improvement Baseline: 53% function Goal status: INITIAL   2.  Pt will improve R ankle DF to no less than 10 deg for improved functional mobility and gait Baseline: lacking 5 deg to neutral Goal status: INITIAL   3.  Pt will decrease Five Time Sit to Stand to no greater than 15 seconds for improved safety and functional mobility Baseline:  Goal status: INITIAL   4.  Pt will decrease TUG to no greater than 15 seconds with no AD for improved balance and mobility Baseline: 30 seconds in CAM with FWW Goal status: INITIAL   PLAN: PT FREQUENCY: 2x/week   PT DURATION: 10 weeks   PLANNED INTERVENTIONS: Therapeutic exercises, Therapeutic activity, Neuromuscular re-education, Balance training, Gait training, Patient/Family education, Joint mobilization, Aquatic Therapy, Dry  Needling, Electrical stimulation, Cryotherapy, Moist heat, Vasopneumatic device, Manual therapy, and Re-evaluation   PLAN FOR NEXT SESSION: assess HEP response, progress gait and LE strength/balance, trial gait in // bars next session as long as pt brings matching shoe    Harland GermanStephanie Shye Doty, PTA 01/18/2022, 2:32 PM

## 2022-01-19 DIAGNOSIS — S82851D Displaced trimalleolar fracture of right lower leg, subsequent encounter for closed fracture with routine healing: Secondary | ICD-10-CM | POA: Diagnosis not present

## 2022-01-20 ENCOUNTER — Ambulatory Visit: Payer: Medicare Other

## 2022-01-20 DIAGNOSIS — M6281 Muscle weakness (generalized): Secondary | ICD-10-CM

## 2022-01-20 DIAGNOSIS — R2681 Unsteadiness on feet: Secondary | ICD-10-CM

## 2022-01-20 DIAGNOSIS — R2689 Other abnormalities of gait and mobility: Secondary | ICD-10-CM | POA: Diagnosis not present

## 2022-01-20 NOTE — Therapy (Signed)
OUTPATIENT PHYSICAL THERAPY TREATMENT NOTE   Patient Name: Nico Rogness MRN: [id number] DOB:1946-01-23, 76 y.o., female Today's Date: 01/20/2022  PCP: Merri Brunette, MD REFERRING PROVIDER: Netta Cedars, MD  END OF SESSION:   PT End of Session - 01/20/22 1131     Visit Number 3    Number of Visits 20    Date for PT Re-Evaluation 03/23/22    Authorization Type [employer] Medicare    PT Start Time 1130    PT Stop Time 1215    PT Time Calculation (min) 45 min    Activity Tolerance Patient tolerated treatment well    Behavior During Therapy WFL for tasks assessed/performed              Past Medical History:  Diagnosis Date   Detached retina    Hyperlipemia    Hypertension    Past Surgical History:  Procedure Laterality Date   ORIF ANKLE FRACTURE Right 10/30/2021   Procedure: RIGHT TRIMALLEOLAR OPEN REDUCTION INTERNAL FIXATION (ORIF) ANKLE FRACTURE,;  Surgeon: Netta Cedars, MD;  Location: [facility];  Service: Orthopedics;  Laterality: Right;   TOTAL HIP ARTHROPLASTY Right 07/08/2021   Procedure: TOTAL HIP ARTHROPLASTY ANTERIOR APPROACH;  Surgeon: Samson Frederic, MD;  Location: [facility];  Service: Orthopedics;  Laterality: Right;   Patient Active Problem List   Diagnosis Date Noted   Hypokalemia 10/28/2021   Trimalleolar fracture of ankle, closed, right, initial encounter 10/28/2021   Femoral neck fracture (HCC) 07/07/2021   Essential hypertension 07/07/2021   Anxiety 07/07/2021   Depression 07/07/2021   Hyperlipidemia 12/19/2014    REFERRING DIAG: closed trimalleolar fracture of right ankle  THERAPY DIAG:  Muscle weakness (generalized)  Unsteadiness on feet  Other abnormalities of gait and mobility  Rationale for Evaluation and Treatment Rehabilitation  PERTINENT HISTORY: HTN, Hx of R anterior THA  PRECAUTIONS: None  ONSET DATE: 10/27/2021  SUBJECTIVE: Patient reports no pain today and HEP compliance. She states that she has a follow up appointment  with her doctor on 6/23. She states she felt a little sore after her last session.  PAIN:  Are you having pain?  No: NPRS scale: 0/10 (1/10) Pain location: R ankle Pain description: tight Aggravating factors: prolonged standing, walking Relieving factors: rest   OBJECTIVE: (objective measures completed at initial evaluation unless otherwise dated)   DIAGNOSTIC FINDINGS:            See imaging    PATIENT SURVEYS:  FOTO 53% function; 69% predicted   COGNITION:           Overall cognitive status: Within functional limits for tasks assessed                          SENSATION: WFL   POSTURE: rounded shoulders, forward head, and weight shift left   PALPATION: No TTP tnoted   LOWER EXTREMITY ROM:   AROM Right 01/12/2022 Left 01/12/2022  Hip flexion       Hip extension      Hip abduction      Hip adduction      Hip internal rotation      Hip external rotation      Knee flexion      Knee extension      Ankle dorsiflexion      Ankle plantarflexion      Ankle inversion      Ankle eversion       (Blank rows = not tested)   LOWER  EXTREMITY MMT:     MMT Right 01/12/2022  Left 01/12/2022   Hip flexion       Hip extension      Hip abduction      Hip adduction      Hip external rotation      Hip internal rotation      Knee extension      Knee flexion      Ankle dorsiflexion  3+/5    Ankle plantarflexion 3+/5    Ankle inversion 3+/5    Ankle eversion 3+/5    Grossly   5/5  (Blank rows = not tested)   LOWER EXTREMITY SPECIAL TESTS:  N/A   FUNCTIONAL TESTS:  Five Time Sit to Stand: 23 seconds TUG: 30 seconds in CAM with FWW   GAIT: Distance walked: 72ft Assistive device utilized: Environmental consultant - 2 wheeled and CAM boot on R foot Level of assistance: Modified independence Comments: step-to gait   TODAY'S TREATMENT: [facility] Adult PT Treatment:                                                DATE: 01/20/2022 Therapeutic Exercise: Nustep level 5 x 5 mins LAQ Rt 3x10 Seated  marching 3x10 BIL Seated heel raises 3 x 10  Seated toe raises 3x10 Therapeutic Activity in // bars: Weight shifting lateral/forward/retro Gait with cues for heel toe, increasing stride length, weight shifting onto R x 2 laps Gait over 4 hurdles forward/sideways, frequent VCs for lifting knees and heel strike x 2 laps NMRE: Romberg stance on Airex Tandem stance x30" BIL    [facility] Adult PT Treatment:                                                DATE: 01/18/2022 Therapeutic Exercise: Nustep level 5 x 5 mins LAQ Rt 3x10 Seated marching 3x10 BIL SLR 3x10 Rt Ankle 4 way RTB 2x10 each Seated heel raises 3 x 10  Seated toe raises 3x10 Ankle inversion/eversion towel slide x 2' - Right   [facility] Adult PT Treatment:                                                DATE: 01/12/2022 Therapeutic Exercise: Calf stretch with towel x 30" R Seated heel raises x 10 Ankle inversion/eversion towel slide x 5 - Right Supine SLR x 5 - Right   PATIENT EDUCATION:  Education details: eval findings, FOTO, HEP, POC Person educated: Patient Education method: Explanation, Demonstration, and Handouts Education comprehension: verbalized understanding and returned demonstration     HOME EXERCISE PROGRAM: Access Code: [email or web address] URL: [email or web address] Date: 01/18/2022 Prepared by: Harland German  Exercises - Long Sitting Calf Stretch with Strap  - 3 x daily - 7 x weekly - 2-3 reps - 30 seconds hold - Seated Heel Raise  - 3 x daily - 7 x weekly - 3 sets - 10 reps - Ankle Inversion Eversion Towel Slide  - 3 x daily - 7 x weekly - 3 sets - 10 reps - Supine Active Straight Leg Raise  - 3 x daily -  7 x weekly - 3 sets - 10 reps Added 01/18/2022 - Long Sitting Ankle Eversion with Resistance  - 1 x daily - 7 x weekly - 3 sets - 10 reps - Long Sitting Ankle Dorsiflexion with Anchored Resistance  - 1 x daily - 7 x weekly - 3 sets - 10 reps - Long Sitting Ankle Inversion with Resistance  - 1 x daily  - 7 x weekly - 3 sets - 10 reps - Long Sitting Ankle Plantar Flexion with Resistance  - 1 x daily - 7 x weekly - 3 sets - 10 reps   ASSESSMENT:   CLINICAL IMPRESSION: Patient presents to PT with no current pain and reports mild muscle soreness after last session in her R calf. She brought her shoe today and we worked outside of the CAM boot in the parallel bars with no adverse effects. She is challenged with the sequencing of gait training and requires frequent verbal cues to sequence correctly. Patient was able to tolerate all prescribed exercises with no adverse effects. Patient continues to benefit from skilled PT services and should be progressed as able to improve functional independence.     OBJECTIVE IMPAIRMENTS decreased activity tolerance, decreased balance, decreased mobility, difficulty walking, decreased ROM, decreased strength, and pain.    ACTIVITY LIMITATIONS carrying, lifting, bending, standing, squatting, stairs, and transfers   PARTICIPATION LIMITATIONS: meal prep, cleaning, laundry, driving, community activity, and yard work   [place], Time since onset of injury/illness/exacerbation, and 1-2 comorbidities: HTN  are also affecting patient's functional outcome.    REHAB POTENTIAL: Excellent   CLINICAL DECISION MAKING: Stable/uncomplicated   EVALUATION COMPLEXITY: Low     GOALS: Goals reviewed with patient? No   SHORT TERM GOALS: Target date: 02/02/2022  Pt will be compliant and knowledgeable with initial HEP for improved comfort and carryover Baseline: initial HEP given Goal status: INITIAL   2.  Pt will self report no right ankle pain for improved comfort and functional ability Baseline: 1/10 at worst Goal status: INITIAL   LONG TERM GOALS: Target date: 03/23/2022    Pt will improve FOTO function score to no less than 69% as proxy for functional improvement Baseline: 53% function Goal status: INITIAL   2.  Pt will improve R ankle DF to no  less than 10 deg for improved functional mobility and gait Baseline: lacking 5 deg to neutral Goal status: INITIAL   3.  Pt will decrease Five Time Sit to Stand to no greater than 15 seconds for improved safety and functional mobility Baseline:  Goal status: INITIAL   4.  Pt will decrease TUG to no greater than 15 seconds with no AD for improved balance and mobility Baseline: 30 seconds in CAM with FWW Goal status: INITIAL   PLAN: PT FREQUENCY: 2x/week   PT DURATION: 10 weeks   PLANNED INTERVENTIONS: Therapeutic exercises, Therapeutic activity, Neuromuscular re-education, Balance training, Gait training, Patient/Family education, Joint mobilization, Aquatic Therapy, Dry Needling, Electrical stimulation, Cryotherapy, Moist heat, Vasopneumatic device, Manual therapy, and Re-evaluation   PLAN FOR NEXT SESSION: assess HEP response, progress gait and LE strength/balance, work on gait in // bars outside of CAM [employer], PTA 01/20/2022, 12:10 PM

## 2022-01-25 ENCOUNTER — Ambulatory Visit: Payer: Medicare Other

## 2022-01-25 DIAGNOSIS — M6281 Muscle weakness (generalized): Secondary | ICD-10-CM

## 2022-01-25 DIAGNOSIS — R2689 Other abnormalities of gait and mobility: Secondary | ICD-10-CM | POA: Diagnosis not present

## 2022-01-25 DIAGNOSIS — R2681 Unsteadiness on feet: Secondary | ICD-10-CM | POA: Diagnosis not present

## 2022-01-25 NOTE — Therapy (Signed)
OUTPATIENT PHYSICAL THERAPY TREATMENT NOTE   Patient Name: Jessica Anthony MRN: [id number] DOB:1946/07/21, 76 y.o., female Today's Date: 01/25/2022  PCP: Merri Brunette, MD REFERRING PROVIDER: Netta Cedars, MD  END OF SESSION:   PT End of Session - 01/25/22 1128     Visit Number 4    Number of Visits 20    Date for PT Re-Evaluation 03/23/22    Authorization Type [employer] Medicare    PT Start Time 1130    PT Stop Time 1210    PT Time Calculation (min) 40 min    Activity Tolerance Patient tolerated treatment well    Behavior During Therapy WFL for tasks assessed/performed               Past Medical History:  Diagnosis Date   Detached retina    Hyperlipemia    Hypertension    Past Surgical History:  Procedure Laterality Date   ORIF ANKLE FRACTURE Right 10/30/2021   Procedure: RIGHT TRIMALLEOLAR OPEN REDUCTION INTERNAL FIXATION (ORIF) ANKLE FRACTURE,;  Surgeon: Netta Cedars, MD;  Location: [facility];  Service: Orthopedics;  Laterality: Right;   TOTAL HIP ARTHROPLASTY Right 07/08/2021   Procedure: TOTAL HIP ARTHROPLASTY ANTERIOR APPROACH;  Surgeon: Samson Frederic, MD;  Location: [facility];  Service: Orthopedics;  Laterality: Right;   Patient Active Problem List   Diagnosis Date Noted   Hypokalemia 10/28/2021   Trimalleolar fracture of ankle, closed, right, initial encounter 10/28/2021   Femoral neck fracture (HCC) 07/07/2021   Essential hypertension 07/07/2021   Anxiety 07/07/2021   Depression 07/07/2021   Hyperlipidemia 12/19/2014    REFERRING DIAG: closed trimalleolar fracture of right ankle  THERAPY DIAG:  Muscle weakness (generalized)  Unsteadiness on feet  Other abnormalities of gait and mobility  Rationale for Evaluation and Treatment Rehabilitation  PERTINENT HISTORY: HTN, Hx of R anterior THA  PRECAUTIONS: None  ONSET DATE: 10/27/2021  SUBJECTIVE: Patient reports occasional soreness in her quads and calf muscles, but no current pain in her  ankle.  PAIN:  Are you having pain?  No: NPRS scale: 0/10 (1/10) Pain location: R ankle Pain description: tight Aggravating factors: prolonged standing, walking Relieving factors: rest   OBJECTIVE: (objective measures completed at initial evaluation unless otherwise dated)   DIAGNOSTIC FINDINGS:            See imaging    PATIENT SURVEYS:  FOTO 53% function; 69% predicted   COGNITION:           Overall cognitive status: Within functional limits for tasks assessed                          SENSATION: WFL   POSTURE: rounded shoulders, forward head, and weight shift left   PALPATION: No TTP tnoted   LOWER EXTREMITY ROM:   AROM Right 01/12/2022 Left 01/12/2022  Hip flexion       Hip extension      Hip abduction      Hip adduction      Hip internal rotation      Hip external rotation      Knee flexion      Knee extension      Ankle dorsiflexion      Ankle plantarflexion      Ankle inversion      Ankle eversion       (Blank rows = not tested)   LOWER EXTREMITY MMT:     MMT Right 01/12/2022  Left 01/12/2022   Hip  flexion       Hip extension      Hip abduction      Hip adduction      Hip external rotation      Hip internal rotation      Knee extension      Knee flexion      Ankle dorsiflexion  3+/5    Ankle plantarflexion 3+/5    Ankle inversion 3+/5    Ankle eversion 3+/5    Grossly   5/5  (Blank rows = not tested)   LOWER EXTREMITY SPECIAL TESTS:  N/A   FUNCTIONAL TESTS:  Five Time Sit to Stand: 23 seconds TUG: 30 seconds in CAM with FWW   GAIT: Distance walked: 16ft Assistive device utilized: Environmental consultant - 2 wheeled and CAM boot on R foot Level of assistance: Modified independence Comments: step-to gait   TODAY'S TREATMENT: [facility] Adult PT Treatment:                                                DATE: 01/25/2022 Therapeutic Exercise: Nustep level 5 x 5 mins In // bars: Standing heel raises 2x10 Standing toe raises 2x10  Standing marching 2x10  BIL Therapeutic Activity in // bars: Gait with cues for heel toe, increasing stride length, weight shifting onto R x 3 laps Gait over 4 hurdles forward/sideways, frequent VCs for lifting knees and heel strike x 2 laps Step ups 4" Rt leading 2x10 (frequent VC to sequence) NMRE in // bars: Romberg stance x30" EO/EC Romberg stance on Airex x30" EO Tandem stance x30" BIL  Tandem stance on airex x30" BIL   [facility] Adult PT Treatment:                                                DATE: 01/20/2022 Therapeutic Exercise: Nustep level 5 x 5 mins LAQ Rt 3x10 Seated marching 3x10 BIL Seated heel raises 3 x 10  Seated toe raises 3x10 Therapeutic Activity in // bars: Weight shifting lateral/forward/retro Gait with cues for heel toe, increasing stride length, weight shifting onto R x 2 laps Gait over 4 hurdles forward/sideways, frequent VCs for lifting knees and heel strike x 2 laps NMRE: Romberg stance on Airex Tandem stance x30" BIL    [facility] Adult PT Treatment:                                                DATE: 01/18/2022 Therapeutic Exercise: Nustep level 5 x 5 mins LAQ Rt 3x10 Seated marching 3x10 BIL SLR 3x10 Rt Ankle 4 way RTB 2x10 each Seated heel raises 3 x 10  Seated toe raises 3x10 Ankle inversion/eversion towel slide x 2' - Right    PATIENT EDUCATION:  Education details: eval findings, FOTO, HEP, POC Person educated: Patient Education method: Explanation, Demonstration, and Handouts Education comprehension: verbalized understanding and returned demonstration     HOME EXERCISE PROGRAM: Access Code: [email or web address] URL: [email or web address] Date: 01/18/2022 Prepared by: Harland German  Exercises - Long Sitting Calf Stretch with Strap  - 3 x daily - 7 x weekly - 2-3 reps - 30  seconds hold - Seated Heel Raise  - 3 x daily - 7 x weekly - 3 sets - 10 reps - Ankle Inversion Eversion Towel Slide  - 3 x daily - 7 x weekly - 3 sets - 10 reps - Supine Active Straight  Leg Raise  - 3 x daily - 7 x weekly - 3 sets - 10 reps Added 01/18/2022 - Long Sitting Ankle Eversion with Resistance  - 1 x daily - 7 x weekly - 3 sets - 10 reps - Long Sitting Ankle Dorsiflexion with Anchored Resistance  - 1 x daily - 7 x weekly - 3 sets - 10 reps - Long Sitting Ankle Inversion with Resistance  - 1 x daily - 7 x weekly - 3 sets - 10 reps - Long Sitting Ankle Plantar Flexion with Resistance  - 1 x daily - 7 x weekly - 3 sets - 10 reps   ASSESSMENT:   CLINICAL IMPRESSION: Patient presents to PT with no current pain and reports HEP compliance. Session today focused on strengthening and gait training outside of the CAM boot in the parallel bars. She requires frequent verbal cues to sequence gait pattern. Patient was able to tolerate all prescribed exercises with no adverse effects. Patient continues to benefit from skilled PT services and should be progressed as able to improve functional independence.    OBJECTIVE IMPAIRMENTS decreased activity tolerance, decreased balance, decreased mobility, difficulty walking, decreased ROM, decreased strength, and pain.    ACTIVITY LIMITATIONS carrying, lifting, bending, standing, squatting, stairs, and transfers   PARTICIPATION LIMITATIONS: meal prep, cleaning, laundry, driving, community activity, and yard work   [place], Time since onset of injury/illness/exacerbation, and 1-2 comorbidities: HTN  are also affecting patient's functional outcome.    REHAB POTENTIAL: Excellent   CLINICAL DECISION MAKING: Stable/uncomplicated   EVALUATION COMPLEXITY: Low     GOALS: Goals reviewed with patient? No   SHORT TERM GOALS: Target date: 02/02/2022  Pt will be compliant and knowledgeable with initial HEP for improved comfort and carryover Baseline: initial HEP given Goal status: INITIAL   2.  Pt will self report no right ankle pain for improved comfort and functional ability Baseline: 1/10 at worst Goal status: INITIAL    LONG TERM GOALS: Target date: 03/23/2022    Pt will improve FOTO function score to no less than 69% as proxy for functional improvement Baseline: 53% function Goal status: INITIAL   2.  Pt will improve R ankle DF to no less than 10 deg for improved functional mobility and gait Baseline: lacking 5 deg to neutral Goal status: INITIAL   3.  Pt will decrease Five Time Sit to Stand to no greater than 15 seconds for improved safety and functional mobility Baseline:  Goal status: INITIAL   4.  Pt will decrease TUG to no greater than 15 seconds with no AD for improved balance and mobility Baseline: 30 seconds in CAM with FWW Goal status: INITIAL   PLAN: PT FREQUENCY: 2x/week   PT DURATION: 10 weeks   PLANNED INTERVENTIONS: Therapeutic exercises, Therapeutic activity, Neuromuscular re-education, Balance training, Gait training, Patient/Family education, Joint mobilization, Aquatic Therapy, Dry Needling, Electrical stimulation, Cryotherapy, Moist heat, Vasopneumatic device, Manual therapy, and Re-evaluation   PLAN FOR NEXT SESSION: assess HEP response, progress gait and LE strength/balance, work on gait in // bars outside of CAM [employer], PTA 01/25/2022, 12:09 PM

## 2022-01-26 ENCOUNTER — Ambulatory Visit: Payer: Medicare Other

## 2022-01-26 DIAGNOSIS — R2689 Other abnormalities of gait and mobility: Secondary | ICD-10-CM | POA: Diagnosis not present

## 2022-01-26 DIAGNOSIS — R2681 Unsteadiness on feet: Secondary | ICD-10-CM

## 2022-01-26 DIAGNOSIS — M6281 Muscle weakness (generalized): Secondary | ICD-10-CM

## 2022-01-26 NOTE — Therapy (Signed)
OUTPATIENT PHYSICAL THERAPY TREATMENT NOTE   Patient Name: Jessica Anthony MRN: [id number] DOB:[birth year], 76 y.o., female Today's Date: 01/26/2022  PCP: Merri Brunette, MD REFERRING PROVIDER: Netta Cedars, MD  END OF SESSION:   PT End of Session - 01/26/22 1208     Visit Number 5    Number of Visits 20    Date for PT Re-Evaluation 03/23/22    Authorization Type [employer] Medicare    PT Start Time 1210    PT Stop Time 1252    PT Time Calculation (min) 42 min    Activity Tolerance Patient tolerated treatment well    Behavior During Therapy WFL for tasks assessed/performed                Past Medical History:  Diagnosis Date   Detached retina    Hyperlipemia    Hypertension    Past Surgical History:  Procedure Laterality Date   ORIF ANKLE FRACTURE Right 10/30/2021   Procedure: RIGHT TRIMALLEOLAR OPEN REDUCTION INTERNAL FIXATION (ORIF) ANKLE FRACTURE,;  Surgeon: Netta Cedars, MD;  Location: [facility];  Service: Orthopedics;  Laterality: Right;   TOTAL HIP ARTHROPLASTY Right 07/08/2021   Procedure: TOTAL HIP ARTHROPLASTY ANTERIOR APPROACH;  Surgeon: Samson Frederic, MD;  Location: [facility];  Service: Orthopedics;  Laterality: Right;   Patient Active Problem List   Diagnosis Date Noted   Hypokalemia 10/28/2021   Trimalleolar fracture of ankle, closed, right, initial encounter 10/28/2021   Femoral neck fracture (HCC) 07/07/2021   Essential hypertension 07/07/2021   Anxiety 07/07/2021   Depression 07/07/2021   Hyperlipidemia 12/19/2014    REFERRING DIAG: closed trimalleolar fracture of right ankle  THERAPY DIAG:  Muscle weakness (generalized)  Unsteadiness on feet  Rationale for Evaluation and Treatment Rehabilitation  PERTINENT HISTORY: HTN, Hx of R anterior THA  PRECAUTIONS: None  ONSET DATE: 10/27/2021  SUBJECTIVE:  Pt presents to PT with no current pain or discomfort. Pt has a follow up with MD on Friday. Has been compliant with HEP with no adverse  effect. Pt is ready to begin PT at this time.   PAIN:  Are you having pain?  No: NPRS scale: 0/10 (1/10) Pain location: R ankle Pain description: tight Aggravating factors: prolonged standing, walking Relieving factors: rest   OBJECTIVE: (objective measures completed at initial evaluation unless otherwise dated)   PATIENT SURVEYS:  None Today   LOWER EXTREMITY ROM:   AROM Right 01/12/2022 Left 01/12/2022  Hip flexion       Hip extension      Hip abduction      Hip adduction      Hip internal rotation      Hip external rotation      Knee flexion      Knee extension      Ankle dorsiflexion      Ankle plantarflexion      Ankle inversion      Ankle eversion       (Blank rows = not tested)   LOWER EXTREMITY MMT:     MMT Right 01/12/2022  Left 01/12/2022   Hip flexion       Hip extension      Hip abduction      Hip adduction      Hip external rotation      Hip internal rotation      Knee extension      Knee flexion      Ankle dorsiflexion  3+/5    Ankle plantarflexion 3+/5  Ankle inversion 3+/5    Ankle eversion 3+/5    Grossly   5/5  (Blank rows = not tested)   FUNCTIONAL TESTS:  Five Time Sit to Stand: 23 seconds TUG: 30 seconds in CAM with FWW    TODAY'S TREATMENT: [facility] Adult PT Treatment:                                                DATE: 01/25/2022 Therapeutic Exercise: Nustep level 6 x 5 mins while taking subjective Standing weight shift x 10 Standing heel-toe raises x 15 Seated heel raise 10# KB 2x15 Seated calf stretch 2x30" R Supine ankle DF/inv/ev 2x15 RTB Supine SLR 2x10 each STS x 10 BAPS L3 cw/ccw x 5 each - R   [facility] Adult PT Treatment:                                                DATE: 01/20/2022 Therapeutic Exercise: Nustep level 5 x 5 mins LAQ Rt 3x10 Seated marching 3x10 BIL Seated heel raises 3 x 10  Seated toe raises 3x10 Therapeutic Activity in // bars: Weight shifting lateral/forward/retro Gait with cues for heel toe,  increasing stride length, weight shifting onto R x 2 laps Gait over 4 hurdles forward/sideways, frequent VCs for lifting knees and heel strike x 2 laps NMRE: Romberg stance on Airex Tandem stance x30" BIL    [facility] Adult PT Treatment:                                                DATE: 01/18/2022 Therapeutic Exercise: Nustep level 5 x 5 mins LAQ Rt 3x10 Seated marching 3x10 BIL SLR 3x10 Rt Ankle 4 way RTB 2x10 each Seated heel raises 3 x 10  Seated toe raises 3x10 Ankle inversion/eversion towel slide x 2' - Right    PATIENT EDUCATION:  Education details: eval findings, FOTO, HEP, POC Person educated: Patient Education method: Explanation, Demonstration, and Handouts Education comprehension: verbalized understanding and returned demonstration     HOME EXERCISE PROGRAM: Access Code: [email or web address] URL: [email or web address] Date: 01/18/2022 Prepared by: Harland German  Exercises - Long Sitting Calf Stretch with Strap  - 3 x daily - 7 x weekly - 2-3 reps - 30 seconds hold - Seated Heel Raise  - 3 x daily - 7 x weekly - 3 sets - 10 reps - Ankle Inversion Eversion Towel Slide  - 3 x daily - 7 x weekly - 3 sets - 10 reps - Supine Active Straight Leg Raise  - 3 x daily - 7 x weekly - 3 sets - 10 reps Added 01/18/2022 - Long Sitting Ankle Eversion with Resistance  - 1 x daily - 7 x weekly - 3 sets - 10 reps - Long Sitting Ankle Dorsiflexion with Anchored Resistance  - 1 x daily - 7 x weekly - 3 sets - 10 reps - Long Sitting Ankle Inversion with Resistance  - 1 x daily - 7 x weekly - 3 sets - 10 reps - Long Sitting Ankle Plantar Flexion with Resistance  - 1 x daily - 7 x weekly -  3 sets - 10 reps   ASSESSMENT:   CLINICAL IMPRESSION: Pt was able to complete all prescribed exercises with no adverse effect or incease in pain. Therapy continued to progress distal LE strength and mobility exercises. Pt conitnues to progress very well with therapy and will continue to be seen and  progressed as able per POC.    OBJECTIVE IMPAIRMENTS decreased activity tolerance, decreased balance, decreased mobility, difficulty walking, decreased ROM, decreased strength, and pain.    ACTIVITY LIMITATIONS carrying, lifting, bending, standing, squatting, stairs, and transfers   PARTICIPATION LIMITATIONS: meal prep, cleaning, laundry, driving, community activity, and yard work   PERSONAL FACTORS [place], Time since onset of injury/illness/exacerbation, and 1-2 comorbidities: HTN  are also affecting patient's functional outcome.      GOALS: Goals reviewed with patient? No   SHORT TERM GOALS: Target date: 02/02/2022  Pt will be compliant and knowledgeable with initial HEP for improved comfort and carryover Baseline: initial HEP given Goal status: INITIAL   2.  Pt will self report no right ankle pain for improved comfort and functional ability Baseline: 1/10 at worst Goal status: INITIAL   LONG TERM GOALS: Target date: 03/23/2022    Pt will improve FOTO function score to no less than 69% as proxy for functional improvement Baseline: 53% function Goal status: INITIAL   2.  Pt will improve R ankle DF to no less than 10 deg for improved functional mobility and gait Baseline: lacking 5 deg to neutral Goal status: INITIAL   3.  Pt will decrease Five Time Sit to Stand to no greater than 15 seconds for improved safety and functional mobility Baseline:  Goal status: INITIAL   4.  Pt will decrease TUG to no greater than 15 seconds with no AD for improved balance and mobility Baseline: 30 seconds in CAM with FWW Goal status: INITIAL   PLAN: PT FREQUENCY: 2x/week   PT DURATION: 10 weeks   PLANNED INTERVENTIONS: Therapeutic exercises, Therapeutic activity, Neuromuscular re-education, Balance training, Gait training, Patient/Family education, Joint mobilization, Aquatic Therapy, Dry Needling, Electrical stimulation, Cryotherapy, Moist heat, Vasopneumatic device, Manual therapy, and  Re-evaluation   PLAN FOR NEXT SESSION: assess HEP response, progress gait and LE strength/balance, work on gait in // bars outside of CAM boot    Eloy End, PT 01/26/2022, 12:54 PM

## 2022-01-27 DIAGNOSIS — G4700 Insomnia, unspecified: Secondary | ICD-10-CM | POA: Diagnosis not present

## 2022-01-27 DIAGNOSIS — Z23 Encounter for immunization: Secondary | ICD-10-CM | POA: Diagnosis not present

## 2022-01-27 DIAGNOSIS — I10 Essential (primary) hypertension: Secondary | ICD-10-CM | POA: Diagnosis not present

## 2022-01-27 DIAGNOSIS — E782 Mixed hyperlipidemia: Secondary | ICD-10-CM | POA: Diagnosis not present

## 2022-01-27 DIAGNOSIS — Z0000 Encounter for general adult medical examination without abnormal findings: Secondary | ICD-10-CM | POA: Diagnosis not present

## 2022-01-28 DIAGNOSIS — S82851D Displaced trimalleolar fracture of right lower leg, subsequent encounter for closed fracture with routine healing: Secondary | ICD-10-CM | POA: Diagnosis not present

## 2022-01-31 ENCOUNTER — Ambulatory Visit: Payer: Medicare Other

## 2022-01-31 DIAGNOSIS — R2689 Other abnormalities of gait and mobility: Secondary | ICD-10-CM | POA: Diagnosis not present

## 2022-01-31 DIAGNOSIS — R2681 Unsteadiness on feet: Secondary | ICD-10-CM

## 2022-01-31 DIAGNOSIS — M6281 Muscle weakness (generalized): Secondary | ICD-10-CM | POA: Diagnosis not present

## 2022-02-02 ENCOUNTER — Ambulatory Visit: Payer: Medicare Other

## 2022-02-02 DIAGNOSIS — R2681 Unsteadiness on feet: Secondary | ICD-10-CM | POA: Diagnosis not present

## 2022-02-02 DIAGNOSIS — M6281 Muscle weakness (generalized): Secondary | ICD-10-CM | POA: Diagnosis not present

## 2022-02-02 DIAGNOSIS — R2689 Other abnormalities of gait and mobility: Secondary | ICD-10-CM

## 2022-02-02 NOTE — Therapy (Signed)
OUTPATIENT PHYSICAL THERAPY TREATMENT NOTE   Patient Name: Jessica Anthony MRN: [id number] DOB:[birth year], 76 y.o., female Today's Date: 02/02/2022  PCP: Carol Ada, MD REFERRING PROVIDER: Armond Hang, MD  END OF SESSION:   PT End of Session - 02/02/22 1442     Visit Number 7    Number of Visits 20    Date for PT Re-Evaluation 03/23/22    Authorization Type [employer] Medicare    PT Start Time 8413    PT Stop Time 1526    PT Time Calculation (min) 41 min    Activity Tolerance Patient tolerated treatment well    Behavior During Therapy WFL for tasks assessed/performed                  Past Medical History:  Diagnosis Date   Detached retina    Hyperlipemia    Hypertension    Past Surgical History:  Procedure Laterality Date   ORIF ANKLE FRACTURE Right 10/30/2021   Procedure: RIGHT TRIMALLEOLAR OPEN REDUCTION INTERNAL FIXATION (ORIF) ANKLE FRACTURE,;  Surgeon: Armond Hang, MD;  Location: [facility];  Service: Orthopedics;  Laterality: Right;   TOTAL HIP ARTHROPLASTY Right 07/08/2021   Procedure: TOTAL HIP ARTHROPLASTY ANTERIOR APPROACH;  Surgeon: Rod Can, MD;  Location: [facility];  Service: Orthopedics;  Laterality: Right;   Patient Active Problem List   Diagnosis Date Noted   Hypokalemia 10/28/2021   Trimalleolar fracture of ankle, closed, right, initial encounter 10/28/2021   Femoral neck fracture ([place]) 07/07/2021   Essential hypertension 07/07/2021   Anxiety 07/07/2021   Depression 07/07/2021   Hyperlipidemia 12/19/2014    REFERRING DIAG: closed trimalleolar fracture of right ankle  THERAPY DIAG:  Muscle weakness (generalized)  Unsteadiness on feet  Other abnormalities of gait and mobility  Rationale for Evaluation and Treatment Rehabilitation  PERTINENT HISTORY: HTN, Hx of R anterior THA  PRECAUTIONS: None  ONSET DATE: 10/27/2021  SUBJECTIVE:  Pt presents to PT with no current reports of pain or discomfort. Decided to take off ASO  brace due to discomfort. Pt has been compliant with HEP with no adverse effect. Pt is ready to begin PT at this time.   PAIN:  Are you having pain?  No: NPRS scale: 0/10 (1/10) Pain location: R ankle Pain description: tight Aggravating factors: prolonged standing, walking Relieving factors: rest   OBJECTIVE: (objective measures completed at initial evaluation unless otherwise dated)   PATIENT SURVEYS:  None Today   LOWER EXTREMITY ROM:   AROM Right 01/12/2022 Left 01/12/2022  Hip flexion       Hip extension      Hip abduction      Hip adduction      Hip internal rotation      Hip external rotation      Knee flexion      Knee extension      Ankle dorsiflexion      Ankle plantarflexion      Ankle inversion      Ankle eversion       (Blank rows = not tested)   LOWER EXTREMITY MMT:     MMT Right 01/12/2022  Left 01/12/2022   Hip flexion       Hip extension      Hip abduction      Hip adduction      Hip external rotation      Hip internal rotation      Knee extension      Knee flexion      Ankle  dorsiflexion  3+/5    Ankle plantarflexion 3+/5    Ankle inversion 3+/5    Ankle eversion 3+/5    Grossly   5/5  (Blank rows = not tested)   FUNCTIONAL TESTS:  Five Time Sit to Stand: 23 seconds TUG: 30 seconds in CAM with FWW    TODAY'S TREATMENT: [facility] Adult PT Treatment:                                                DATE: 02/02/2022 Therapeutic Exercise: NuStep lvl 5 UE/LE x 5 min while taking subjective STS 2x10 - no UE support  Lateral walk YTB x 2 laps in // Standing hip abd/ext 2x10 each YTB Step ups 2x10 8in each Standing heel-toe raises 2x20 Amb in //  x 5 laps no AD - focus on R heel strike Neuro Re-Ed: Tandem stance 2x30" each FT on foam 2x30" Semi tandem on foam x 30"  [facility] Adult PT Treatment:                                                DATE: 01/31/2022 Therapeutic Exercise: Step ups 2x10 6in each Standing hip abd/ext 2x10 each Standing heel-toe  raises 2x15 Neuro Re-Ed: Tandem stance 2x30" each [place] step overs (6) x 4 laps fwd with recirpocal pattern and lateral with R leading Gait Training: Working on step-to pattern with SPC in L hand while in // x 10 laps; emphasis on cane sequencing  [facility] Adult PT Treatment:                                                DATE: 01/25/2022 Therapeutic Exercise: Nustep level 6 x 5 mins while taking subjective Standing weight shift x 10 Standing heel-toe raises x 15 Seated heel raise 10# KB 2x15 Seated calf stretch 2x30" R Supine ankle DF/inv/ev 2x15 RTB Supine SLR 2x10 each STS x 10 BAPS L3 cw/ccw x 5 each - R   [facility] Adult PT Treatment:                                                DATE: 01/20/2022 Therapeutic Exercise: Nustep level 5 x 5 mins LAQ Rt 3x10 Seated marching 3x10 BIL Seated heel raises 3 x 10  Seated toe raises 3x10 Therapeutic Activity in // bars: Weight shifting lateral/forward/retro Gait with cues for heel toe, increasing stride length, weight shifting onto R x 2 laps Gait over 4 hurdles forward/sideways, frequent VCs for lifting knees and heel strike x 2 laps NMRE: Romberg stance on Airex Tandem stance x30" BIL     PATIENT EDUCATION:  Education details: eval findings, FOTO, HEP, POC Person educated: Patient Education method: Explanation, Demonstration, and Handouts Education comprehension: verbalized understanding and returned demonstration     HOME EXERCISE PROGRAM: Access Code: [email or web address] URL: [email or web address] Date: 01/31/2022 Prepared by: Octavio Manns  Exercises - Long Sitting Calf Stretch with Strap  - 3 x daily - 7 x weekly -  2-3 reps - 30 seconds hold - Seated Heel Raise  - 3 x daily - 7 x weekly - 3 sets - 10 reps - Ankle Inversion Eversion Towel Slide  - 3 x daily - 7 x weekly - 3 sets - 10 reps - Supine Active Straight Leg Raise  - 3 x daily - 7 x weekly - 3 sets - 10 reps - Long Sitting Ankle Eversion with Resistance  - 1 x  daily - 7 x weekly - 3 sets - 10 reps - Long Sitting Ankle Dorsiflexion with Anchored Resistance  - 1 x daily - 7 x weekly - 3 sets - 10 reps - Long Sitting Ankle Inversion with Resistance  - 1 x daily - 7 x weekly - 3 sets - 10 reps - Long Sitting Ankle Plantar Flexion with Resistance  - 1 x daily - 7 x weekly - 3 sets - 10 reps - Standing Tandem Balance with Counter Support  - 1 x daily - 7 x weekly - 2-3 reps - 30 sec hold   ASSESSMENT:   CLINICAL IMPRESSION: Pt was able to complete all prescribed exercises with no adverse effect or increase in pain. Therapy continued to progress LE strengthening and ankle stability. She continues to benefit from skilled PT and has been progressing well. Will continue with current POC.    OBJECTIVE IMPAIRMENTS decreased activity tolerance, decreased balance, decreased mobility, difficulty walking, decreased ROM, decreased strength, and pain.    ACTIVITY LIMITATIONS carrying, lifting, bending, standing, squatting, stairs, and transfers   PARTICIPATION LIMITATIONS: meal prep, cleaning, laundry, driving, community activity, and yard work   [place], Time since onset of injury/illness/exacerbation, and 1-2 comorbidities: HTN  are also affecting patient's functional outcome.      GOALS: Goals reviewed with patient? No   SHORT TERM GOALS: Target date: 02/02/2022  Pt will be compliant and knowledgeable with initial HEP for improved comfort and carryover Baseline: initial HEP given Goal status: MET   2.  Pt will self report no right ankle pain for improved comfort and functional ability Baseline: 1/10 at worst Goal status: MET   LONG TERM GOALS: Target date: 03/23/2022    Pt will improve FOTO function score to no less than 69% as proxy for functional improvement Baseline: 53% function Goal status: INITIAL   2.  Pt will improve R ankle DF to no less than 10 deg for improved functional mobility and gait Baseline: lacking 5 deg to  neutral Goal status: INITIAL   3.  Pt will decrease Five Time Sit to Stand to no greater than 15 seconds for improved safety and functional mobility Baseline:  Goal status: INITIAL   4.  Pt will decrease TUG to no greater than 15 seconds with no AD for improved balance and mobility Baseline: 30 seconds in CAM with FWW Goal status: INITIAL   PLAN: PT FREQUENCY: 2x/week   PT DURATION: 10 weeks   PLANNED INTERVENTIONS: Therapeutic exercises, Therapeutic activity, Neuromuscular re-education, Balance training, Gait training, Patient/Family education, Joint mobilization, Aquatic Therapy, Dry Needling, Electrical stimulation, Cryotherapy, Moist heat, Vasopneumatic device, Manual therapy, and Re-evaluation   PLAN FOR NEXT SESSION: assess HEP response, progress gait and LE strength/balance, work on gait in // bars outside of CAM boot    Ward Chatters, PT 02/02/2022, 3:29 PM

## 2022-02-07 ENCOUNTER — Ambulatory Visit: Payer: Medicare Other | Attending: Orthopaedic Surgery

## 2022-02-07 DIAGNOSIS — R2681 Unsteadiness on feet: Secondary | ICD-10-CM | POA: Insufficient documentation

## 2022-02-07 DIAGNOSIS — R2689 Other abnormalities of gait and mobility: Secondary | ICD-10-CM | POA: Insufficient documentation

## 2022-02-07 DIAGNOSIS — M6281 Muscle weakness (generalized): Secondary | ICD-10-CM | POA: Diagnosis not present

## 2022-02-07 NOTE — Therapy (Signed)
OUTPATIENT PHYSICAL THERAPY TREATMENT NOTE   Patient Name: Jessica Anthony MRN: [id number] DOB:04-Feb-1946, 76 y.o., female Today's Date: 02/07/2022  PCP: Carol Ada, MD REFERRING PROVIDER: Armond Hang, MD  END OF SESSION:   PT End of Session - 02/07/22 1202     Visit Number 8    Number of Visits 20    Date for PT Re-Evaluation 03/23/22    Authorization Type [employer] Medicare    PT Start Time 1215    PT Stop Time 1255    PT Time Calculation (min) 40 min    Activity Tolerance Patient tolerated treatment well    Behavior During Therapy WFL for tasks assessed/performed                   Past Medical History:  Diagnosis Date   Detached retina    Hyperlipemia    Hypertension    Past Surgical History:  Procedure Laterality Date   ORIF ANKLE FRACTURE Right 10/30/2021   Procedure: RIGHT TRIMALLEOLAR OPEN REDUCTION INTERNAL FIXATION (ORIF) ANKLE FRACTURE,;  Surgeon: Armond Hang, MD;  Location: [facility];  Service: Orthopedics;  Laterality: Right;   TOTAL HIP ARTHROPLASTY Right 07/08/2021   Procedure: TOTAL HIP ARTHROPLASTY ANTERIOR APPROACH;  Surgeon: Rod Can, MD;  Location: [facility];  Service: Orthopedics;  Laterality: Right;   Patient Active Problem List   Diagnosis Date Noted   Hypokalemia 10/28/2021   Trimalleolar fracture of ankle, closed, right, initial encounter 10/28/2021   Femoral neck fracture ([place]) 07/07/2021   Essential hypertension 07/07/2021   Anxiety 07/07/2021   Depression 07/07/2021   Hyperlipidemia 12/19/2014    REFERRING DIAG: closed trimalleolar fracture of right ankle  THERAPY DIAG:  Muscle weakness (generalized)  Unsteadiness on feet  Other abnormalities of gait and mobility  Rationale for Evaluation and Treatment Rehabilitation  PERTINENT HISTORY: HTN, Hx of R anterior THA  PRECAUTIONS: None  ONSET DATE: 10/27/2021  SUBJECTIVE:  Pt presents to PT with no current reports of pain or discomfort. Has been compliant with  HEP with no adverse effect. Pt is ready to begin PT at this time.    PAIN:  Are you having pain?  No: NPRS scale: 0/10 (1/10) Pain location: R ankle Pain description: tight Aggravating factors: prolonged standing, walking Relieving factors: rest   OBJECTIVE: (objective measures completed at initial evaluation unless otherwise dated)   PATIENT SURVEYS:  None Today   LOWER EXTREMITY ROM:   AROM Right 01/12/2022 Left 01/12/2022  Hip flexion       Hip extension      Hip abduction      Hip adduction      Hip internal rotation      Hip external rotation      Knee flexion      Knee extension      Ankle dorsiflexion      Ankle plantarflexion      Ankle inversion      Ankle eversion       (Blank rows = not tested)   LOWER EXTREMITY MMT:     MMT Right 01/12/2022  Left 01/12/2022   Hip flexion       Hip extension      Hip abduction      Hip adduction      Hip external rotation      Hip internal rotation      Knee extension      Knee flexion      Ankle dorsiflexion  3+/5    Ankle plantarflexion  3+/5    Ankle inversion 3+/5    Ankle eversion 3+/5    Grossly   5/5  (Blank rows = not tested)   FUNCTIONAL TESTS:  Five Time Sit to Stand: 23 seconds TUG: 30 seconds in CAM with FWW    TODAY'S TREATMENT: [facility] Adult PT Treatment:                                                DATE: 02/07/2022 Therapeutic Exercise: NuStep lvl 5 UE/LE x 5 min while taking subjective STS 2x10 - no UE support  Lateral walk RTB x 2 laps in // Standing hip abd/ext 2x10 each YTB Step ups x 15 8in each Standing heel-toe raises 2x20 Neuro Re-Ed: Tandem stance 2x30" each FT on foam 2x30" Heel-Toe raises on foam x 10  [facility] Adult PT Treatment:                                                DATE: 02/02/2022 Therapeutic Exercise: NuStep lvl 5 UE/LE x 5 min while taking subjective STS 2x10 - no UE support  Lateral walk YTB x 2 laps in // Standing hip abd/ext 2x10 each YTB Step ups 2x10 8in  each Standing heel-toe raises 2x20 Amb in //  x 5 laps no AD - focus on R heel strike Neuro Re-Ed: Tandem stance 2x30" each FT on foam 2x30" Semi tandem on foam x 30"  [facility] Adult PT Treatment:                                                DATE: 01/31/2022 Therapeutic Exercise: Step ups 2x10 6in each Standing hip abd/ext 2x10 each Standing heel-toe raises 2x15 Neuro Re-Ed: Tandem stance 2x30" each [place] step overs (6) x 4 laps fwd with recirpocal pattern and lateral with R leading Gait Training: Working on step-to pattern with SPC in L hand while in // x 10 laps; emphasis on cane sequencing  [facility] Adult PT Treatment:                                                DATE: 01/25/2022 Therapeutic Exercise: Nustep level 6 x 5 mins while taking subjective Standing weight shift x 10 Standing heel-toe raises x 15 Seated heel raise 10# KB 2x15 Seated calf stretch 2x30" R Supine ankle DF/inv/ev 2x15 RTB Supine SLR 2x10 each STS x 10 BAPS L3 cw/ccw x 5 each - R  PATIENT EDUCATION:  Education details: eval findings, FOTO, HEP, POC Person educated: Patient Education method: Explanation, Demonstration, and Handouts Education comprehension: verbalized understanding and returned demonstration     HOME EXERCISE PROGRAM: Access Code: [email or web address] URL: [email or web address] Date: 01/31/2022 Prepared by: Octavio Manns  Exercises - Long Sitting Calf Stretch with Strap  - 3 x daily - 7 x weekly - 2-3 reps - 30 seconds hold - Seated Heel Raise  - 3 x daily - 7 x weekly - 3 sets - 10 reps - Ankle  Inversion Eversion Towel Slide  - 3 x daily - 7 x weekly - 3 sets - 10 reps - Supine Active Straight Leg Raise  - 3 x daily - 7 x weekly - 3 sets - 10 reps - Long Sitting Ankle Eversion with Resistance  - 1 x daily - 7 x weekly - 3 sets - 10 reps - Long Sitting Ankle Dorsiflexion with Anchored Resistance  - 1 x daily - 7 x weekly - 3 sets - 10 reps - Long Sitting Ankle Inversion with  Resistance  - 1 x daily - 7 x weekly - 3 sets - 10 reps - Long Sitting Ankle Plantar Flexion with Resistance  - 1 x daily - 7 x weekly - 3 sets - 10 reps - Standing Tandem Balance with Counter Support  - 1 x daily - 7 x weekly - 2-3 reps - 30 sec hold   ASSESSMENT:   CLINICAL IMPRESSION: Pt was able to complete all prescribed exercises with no adverse effect. Therapy today focused on improving LE strength, ankle stability, and balance. She has continued to progress well with therapy and will continue to be seen and progressed as able per POC.    OBJECTIVE IMPAIRMENTS decreased activity tolerance, decreased balance, decreased mobility, difficulty walking, decreased ROM, decreased strength, and pain.    ACTIVITY LIMITATIONS carrying, lifting, bending, standing, squatting, stairs, and transfers   PARTICIPATION LIMITATIONS: meal prep, cleaning, laundry, driving, community activity, and yard work   [place], Time since onset of injury/illness/exacerbation, and 1-2 comorbidities: HTN  are also affecting patient's functional outcome.      GOALS: Goals reviewed with patient? No   SHORT TERM GOALS: Target date: 02/02/2022  Pt will be compliant and knowledgeable with initial HEP for improved comfort and carryover Baseline: initial HEP given Goal status: MET   2.  Pt will self report no right ankle pain for improved comfort and functional ability Baseline: 1/10 at worst Goal status: MET   LONG TERM GOALS: Target date: 03/23/2022    Pt will improve FOTO function score to no less than 69% as proxy for functional improvement Baseline: 53% function Goal status: INITIAL   2.  Pt will improve R ankle DF to no less than 10 deg for improved functional mobility and gait Baseline: lacking 5 deg to neutral Goal status: INITIAL   3.  Pt will decrease Five Time Sit to Stand to no greater than 15 seconds for improved safety and functional mobility Baseline:  Goal status: INITIAL   4.   Pt will decrease TUG to no greater than 15 seconds with no AD for improved balance and mobility Baseline: 30 seconds in CAM with FWW Goal status: INITIAL   PLAN: PT FREQUENCY: 2x/week   PT DURATION: 10 weeks   PLANNED INTERVENTIONS: Therapeutic exercises, Therapeutic activity, Neuromuscular re-education, Balance training, Gait training, Patient/Family education, Joint mobilization, Aquatic Therapy, Dry Needling, Electrical stimulation, Cryotherapy, Moist heat, Vasopneumatic device, Manual therapy, and Re-evaluation   PLAN FOR NEXT SESSION: assess HEP response, progress gait and LE strength/balance, work on gait in // bars outside of CAM boot    Ward Chatters, PT 02/07/2022, 1:44 PM

## 2022-02-09 ENCOUNTER — Ambulatory Visit: Payer: Medicare Other

## 2022-02-09 DIAGNOSIS — R2681 Unsteadiness on feet: Secondary | ICD-10-CM

## 2022-02-09 DIAGNOSIS — M6281 Muscle weakness (generalized): Secondary | ICD-10-CM | POA: Diagnosis not present

## 2022-02-09 DIAGNOSIS — R2689 Other abnormalities of gait and mobility: Secondary | ICD-10-CM | POA: Diagnosis not present

## 2022-02-09 NOTE — Therapy (Signed)
OUTPATIENT PHYSICAL THERAPY TREATMENT NOTE   Patient Name: Jessica Anthony MRN: [id number] DOB:1945/12/18, 76 y.o., female Today's Date: 02/09/2022  PCP: Merri Brunette, MD REFERRING PROVIDER: Netta Cedars, MD  END OF SESSION:   PT End of Session - 02/09/22 1354     Visit Number 9    Number of Visits 20    Date for PT Re-Evaluation 03/23/22    Authorization Type [employer] Medicare    PT Start Time 1400    PT Stop Time 1439    PT Time Calculation (min) 39 min    Activity Tolerance Patient tolerated treatment well    Behavior During Therapy WFL for tasks assessed/performed                    Past Medical History:  Diagnosis Date   Detached retina    Hyperlipemia    Hypertension    Past Surgical History:  Procedure Laterality Date   ORIF ANKLE FRACTURE Right 10/30/2021   Procedure: RIGHT TRIMALLEOLAR OPEN REDUCTION INTERNAL FIXATION (ORIF) ANKLE FRACTURE,;  Surgeon: Netta Cedars, MD;  Location: [facility];  Service: Orthopedics;  Laterality: Right;   TOTAL HIP ARTHROPLASTY Right 07/08/2021   Procedure: TOTAL HIP ARTHROPLASTY ANTERIOR APPROACH;  Surgeon: Samson Frederic, MD;  Location: [facility];  Service: Orthopedics;  Laterality: Right;   Patient Active Problem List   Diagnosis Date Noted   Hypokalemia 10/28/2021   Trimalleolar fracture of ankle, closed, right, initial encounter 10/28/2021   Femoral neck fracture ([facility]) 07/07/2021   Essential hypertension 07/07/2021   Anxiety 07/07/2021   Depression 07/07/2021   Hyperlipidemia 12/19/2014    REFERRING DIAG: closed trimalleolar fracture of right ankle  THERAPY DIAG:  Muscle weakness (generalized)  Unsteadiness on feet  Other abnormalities of gait and mobility  Rationale for Evaluation and Treatment Rehabilitation  PERTINENT HISTORY: HTN, Hx of R anterior THA  PRECAUTIONS: None  ONSET DATE: 10/27/2021  SUBJECTIVE:  Pt presents to PT with no current reports of pain. Has been compliant with HEP with no  adverse effect. Pt is ready to begin PT at this time.   PAIN:  Are you having pain?  No: NPRS scale: 0/10 (1/10) Pain location: R ankle Pain description: tight Aggravating factors: prolonged standing, walking Relieving factors: rest   OBJECTIVE: (objective measures completed at initial evaluation unless otherwise dated)   PATIENT SURVEYS:  None Today   LOWER EXTREMITY ROM:   AROM Right 01/12/2022 Left 01/12/2022  Hip flexion       Hip extension      Hip abduction      Hip adduction      Hip internal rotation      Hip external rotation      Knee flexion      Knee extension      Ankle dorsiflexion      Ankle plantarflexion      Ankle inversion      Ankle eversion       (Blank rows = not tested)   LOWER EXTREMITY MMT:     MMT Right 01/12/2022  Left 01/12/2022   Hip flexion       Hip extension      Hip abduction      Hip adduction      Hip external rotation      Hip internal rotation      Knee extension      Knee flexion      Ankle dorsiflexion  3+/5    Ankle plantarflexion 3+/5  Ankle inversion 3+/5    Ankle eversion 3+/5    Grossly   5/5  (Blank rows = not tested)   FUNCTIONAL TESTS:  Five Time Sit to Stand: 23 seconds TUG: 30 seconds in CAM with FWW    TODAY'S TREATMENT: [facility] Adult PT Treatment:                                                DATE: 02/09/2022 Therapeutic Exercise: NuStep lvl 5 UE/LE x 5 min while taking subjective Slant board stretch x 30" STS 2x10 - 5# Supine SLR 2x10 each R ankle DF/Inv/Ev RTB 2x15 Step ups x 10 8in each Standing heel-toe raises x 20 Standing hip abd/ext x 10 Wall squat x 8 Neuro Re-Ed: Tandem stance 2x30" each  [facility] Adult PT Treatment:                                                DATE: 02/07/2022 Therapeutic Exercise: NuStep lvl 5 UE/LE x 5 min while taking subjective STS 2x10 - no UE support  Lateral walk RTB x 2 laps in // Standing hip abd/ext 2x10 each YTB Step ups x 15 8in each Standing heel-toe raises  2x20 Neuro Re-Ed: Tandem stance 2x30" each FT on foam 2x30" Heel-Toe raises on foam x 10  [facility] Adult PT Treatment:                                                DATE: 02/02/2022 Therapeutic Exercise: NuStep lvl 5 UE/LE x 5 min while taking subjective STS 2x10 - no UE support  Lateral walk YTB x 2 laps in // Standing hip abd/ext 2x10 each YTB Step ups 2x10 8in each Standing heel-toe raises 2x20 Amb in //  x 5 laps no AD - focus on R heel strike Neuro Re-Ed: Tandem stance 2x30" each FT on foam 2x30" Semi tandem on foam x 30"  PATIENT EDUCATION:  Education details: eval findings, FOTO, HEP, POC Person educated: Patient Education method: Explanation, Demonstration, and Handouts Education comprehension: verbalized understanding and returned demonstration     HOME EXERCISE PROGRAM: Access Code: [email or web address] URL: [email or web address] Date: 01/31/2022 Prepared by: Octavio Manns  Exercises - Long Sitting Calf Stretch with Strap  - 3 x daily - 7 x weekly - 2-3 reps - 30 seconds hold - Seated Heel Raise  - 3 x daily - 7 x weekly - 3 sets - 10 reps - Ankle Inversion Eversion Towel Slide  - 3 x daily - 7 x weekly - 3 sets - 10 reps - Supine Active Straight Leg Raise  - 3 x daily - 7 x weekly - 3 sets - 10 reps - Long Sitting Ankle Eversion with Resistance  - 1 x daily - 7 x weekly - 3 sets - 10 reps - Long Sitting Ankle Dorsiflexion with Anchored Resistance  - 1 x daily - 7 x weekly - 3 sets - 10 reps - Long Sitting Ankle Inversion with Resistance  - 1 x daily - 7 x weekly - 3 sets - 10 reps - Long Sitting Ankle Plantar Flexion  with Resistance  - 1 x daily - 7 x weekly - 3 sets - 10 reps - Standing Tandem Balance with Counter Support  - 1 x daily - 7 x weekly - 2-3 reps - 30 sec hold   ASSESSMENT:   CLINICAL IMPRESSION: Pt was again able to complete all prescribed exercises with no adverse effect or increase in pain. Therapy today continued to progress LE strength and  ballance for decreasing pain and improving mobility. Will continue to progress as able per POC.    OBJECTIVE IMPAIRMENTS decreased activity tolerance, decreased balance, decreased mobility, difficulty walking, decreased ROM, decreased strength, and pain.    ACTIVITY LIMITATIONS carrying, lifting, bending, standing, squatting, stairs, and transfers   PARTICIPATION LIMITATIONS: meal prep, cleaning, laundry, driving, community activity, and yard work   [place], Time since onset of injury/illness/exacerbation, and 1-2 comorbidities: HTN  are also affecting patient's functional outcome.      GOALS: Goals reviewed with patient? No   SHORT TERM GOALS: Target date: 02/02/2022  Pt will be compliant and knowledgeable with initial HEP for improved comfort and carryover Baseline: initial HEP given Goal status: MET   2.  Pt will self report no right ankle pain for improved comfort and functional ability Baseline: 1/10 at worst Goal status: MET   LONG TERM GOALS: Target date: 03/23/2022    Pt will improve FOTO function score to no less than 69% as proxy for functional improvement Baseline: 53% function Goal status: INITIAL   2.  Pt will improve R ankle DF to no less than 10 deg for improved functional mobility and gait Baseline: lacking 5 deg to neutral Goal status: INITIAL   3.  Pt will decrease Five Time Sit to Stand to no greater than 15 seconds for improved safety and functional mobility Baseline:  Goal status: INITIAL   4.  Pt will decrease TUG to no greater than 15 seconds with no AD for improved balance and mobility Baseline: 30 seconds in CAM with FWW Goal status: INITIAL   PLAN: PT FREQUENCY: 2x/week   PT DURATION: 10 weeks   PLANNED INTERVENTIONS: Therapeutic exercises, Therapeutic activity, Neuromuscular re-education, Balance training, Gait training, Patient/Family education, Joint mobilization, Aquatic Therapy, Dry Needling, Electrical stimulation,  Cryotherapy, Moist heat, Vasopneumatic device, Manual therapy, and Re-evaluation   PLAN FOR NEXT SESSION: assess HEP response, progress gait and LE strength/balance, work on gait in // bars outside of CAM boot    Ward Chatters, PT 02/09/2022, 2:41 PM

## 2022-02-14 ENCOUNTER — Ambulatory Visit: Payer: Medicare Other

## 2022-02-14 DIAGNOSIS — R2681 Unsteadiness on feet: Secondary | ICD-10-CM | POA: Diagnosis not present

## 2022-02-14 DIAGNOSIS — R2689 Other abnormalities of gait and mobility: Secondary | ICD-10-CM | POA: Diagnosis not present

## 2022-02-14 DIAGNOSIS — M6281 Muscle weakness (generalized): Secondary | ICD-10-CM | POA: Diagnosis not present

## 2022-02-14 NOTE — Therapy (Signed)
OUTPATIENT PHYSICAL THERAPY TREATMENT NOTE/PROGRESS NOTE  Progress Note Reporting Period 01/12/2022 to 02/14/2022  See note below for Objective Data and Assessment of Progress/Goals.    Patient Name: Natalynn Pedone MRN: [id number] DOB:05-27-1946, 76 y.o., female Today's Date: 02/14/2022  PCP: Carol Ada, MD REFERRING PROVIDER: Armond Hang, MD  END OF SESSION:   PT End of Session - 02/14/22 1442     Visit Number 10    Number of Visits 20    Date for PT Re-Evaluation 03/23/22    Authorization Type [employer] Medicare    PT Start Time 2035    PT Stop Time 1528    PT Time Calculation (min) 43 min    Activity Tolerance Patient tolerated treatment well    Behavior During Therapy WFL for tasks assessed/performed                     Past Medical History:  Diagnosis Date   Detached retina    Hyperlipemia    Hypertension    Past Surgical History:  Procedure Laterality Date   ORIF ANKLE FRACTURE Right 10/30/2021   Procedure: RIGHT TRIMALLEOLAR OPEN REDUCTION INTERNAL FIXATION (ORIF) ANKLE FRACTURE,;  Surgeon: Armond Hang, MD;  Location: [facility];  Service: Orthopedics;  Laterality: Right;   TOTAL HIP ARTHROPLASTY Right 07/08/2021   Procedure: TOTAL HIP ARTHROPLASTY ANTERIOR APPROACH;  Surgeon: Rod Can, MD;  Location: [facility];  Service: Orthopedics;  Laterality: Right;   Patient Active Problem List   Diagnosis Date Noted   Hypokalemia 10/28/2021   Trimalleolar fracture of ankle, closed, right, initial encounter 10/28/2021   Femoral neck fracture ([place]) 07/07/2021   Essential hypertension 07/07/2021   Anxiety 07/07/2021   Depression 07/07/2021   Hyperlipidemia 12/19/2014    REFERRING DIAG: closed trimalleolar fracture of right ankle  THERAPY DIAG:  Muscle weakness (generalized)  Unsteadiness on feet  Other abnormalities of gait and mobility  Rationale for Evaluation and Treatment Rehabilitation  PERTINENT HISTORY: HTN, Hx of R anterior  THA  PRECAUTIONS: None  ONSET DATE: 10/27/2021  SUBJECTIVE:  Pt presents to PT with no current pain. Does note that she has some continued swelling in her R foot. Pt is ready to begin PT at this time.   PAIN:  Are you having pain?  No: NPRS scale: 0/10 (1/10) Pain location: R ankle Pain description: tight Aggravating factors: prolonged standing, walking Relieving factors: rest   OBJECTIVE: (objective measures completed at initial evaluation unless otherwise dated)   PATIENT SURVEYS:  FOTO: 63% function   LOWER EXTREMITY ROM:   AROM Right 01/12/2022 Right 02/14/2022  Ankle dorsiflexion -5  -2   Ankle plantarflexion      Ankle inversion      Ankle eversion       (Blank rows = not tested)   LOWER EXTREMITY MMT:     MMT Right 01/12/2022  Left 01/12/2022   Hip flexion       Hip extension      Hip abduction      Hip adduction      Hip external rotation      Hip internal rotation      Knee extension      Knee flexion      Ankle dorsiflexion  3+/5    Ankle plantarflexion 3+/5    Ankle inversion 3+/5    Ankle eversion 3+/5    Grossly   5/5  (Blank rows = not tested)   FUNCTIONAL TESTS:  Five Time Sit to Stand: 15  seconds TUG: 12 seconds no AD    TODAY'S TREATMENT: [facility] Adult PT Treatment:                                                DATE: 02/14/2022 Therapeutic Exercise: NuStep lvl 4 UE/LE x 5 min while taking subjective R ankle 4-way GTB x 15 each Calf stretch with towep 2x30" Standing gastroc stretch 2x30" R Standing soleus stretch 2x30" R Tandem stance x 30" each Standing heel-toe raises x 25 Wobble board 2x20 fwd/bwd Therapeutic Activity: Assessment of tests/measures, goals and outcomes for progress note   [facility] Adult PT Treatment:                                                DATE: 02/09/2022 Therapeutic Exercise: NuStep lvl 5 UE/LE x 5 min while taking subjective Slant board stretch x 30" STS 2x10 - 5# Supine SLR 2x10 each R ankle DF/Inv/Ev RTB  2x15 Step ups x 10 8in each Standing heel-toe raises x 20 Standing hip abd/ext x 10 Wall squat x 8 Neuro Re-Ed: Tandem stance 2x30" each  [facility] Adult PT Treatment:                                                DATE: 02/07/2022 Therapeutic Exercise: NuStep lvl 5 UE/LE x 5 min while taking subjective STS 2x10 - no UE support  Lateral walk RTB x 2 laps in // Standing hip abd/ext 2x10 each YTB Step ups x 15 8in each Standing heel-toe raises 2x20 Neuro Re-Ed: Tandem stance 2x30" each FT on foam 2x30" Heel-Toe raises on foam x 10   PATIENT EDUCATION:  Education details: continue HEP Person educated: Patient Education method: Explanation, Demonstration, and Handouts Education comprehension: verbalized understanding and returned demonstration     HOME EXERCISE PROGRAM: Access Code: [email or web address] URL: [email or web address] Date: 02/14/2022 Prepared by: Octavio Manns  Exercises - Long Sitting Calf Stretch with Strap  - 3 x daily - 7 x weekly - 2-3 reps - 30 seconds hold - Gastroc Stretch on Wall  - 2 x daily - 7 x weekly - 2-3 reps - 30 seconds hold - Soleus Stretch on Wall  - 2 x daily - 7 x weekly - 2-3 reps - 30 seconds hold - Long Sitting Ankle Dorsiflexion with Anchored Resistance  - 1 x daily - 7 x weekly - 3 sets - 10 reps - Long Sitting Ankle Eversion with Resistance  - 1 x daily - 7 x weekly - 3 sets - 10 reps - Long Sitting Ankle Inversion with Resistance  - 1 x daily - 7 x weekly - 3 sets - 10 reps - Long Sitting Ankle Plantar Flexion with Resistance  - 1 x daily - 7 x weekly - 3 sets - 10 reps - Sit to Stand Without Arm Support  - 1 x daily - 7 x weekly - 3 sets - 10 reps - Standing Tandem Balance with Counter Support  - 1 x daily - 7 x weekly - 2-3 reps - 30 sec hold   ASSESSMENT:   CLINICAL IMPRESSION:  Pt was able to complete all prescribed exercises with no adverse effect or increase in pain. Therapy focused on improving LE strength and ankle stability.  Over the course of PT treatment she has progressed very well, demonstrating improved comfort and functional mobility. She has met most goals with exception of FOTO and ankle DF, which continues to impair her gait. She continues to benefit from skilled PT services and updated HEP for increased work at home. Will continue to progress as able per POC.     OBJECTIVE IMPAIRMENTS decreased activity tolerance, decreased balance, decreased mobility, difficulty walking, decreased ROM, decreased strength, and pain.    ACTIVITY LIMITATIONS carrying, lifting, bending, standing, squatting, stairs, and transfers   PARTICIPATION LIMITATIONS: meal prep, cleaning, laundry, driving, community activity, and yard work   [place], Time since onset of injury/illness/exacerbation, and 1-2 comorbidities: HTN  are also affecting patient's functional outcome.      GOALS: Goals reviewed with patient? No   SHORT TERM GOALS: Target date: 02/02/2022  Pt will be compliant and knowledgeable with initial HEP for improved comfort and carryover Baseline: initial HEP given Goal status: MET   2.  Pt will self report no right ankle pain for improved comfort and functional ability Baseline: 1/10 at worst Goal status: MET   LONG TERM GOALS: Target date: 03/23/2022    Pt will improve FOTO function score to no less than 69% as proxy for functional improvement Baseline: 53% function 02/14/2022: 63% function Goal status: ONGOING   2.  Pt will improve R ankle DF to no less than 10 deg for improved functional mobility and gait Baseline: lacking 5 deg to neutral 02/14/2022: -2 deg Goal status: ONGOING   3.  Pt will decrease Five Time Sit to Stand to no greater than 15 seconds for improved safety and functional mobility Baseline: 23 seconds 02/14/2022: 15 seconds Goal status: MET   4.  Pt will decrease TUG to no greater than 15 seconds with no AD for improved balance and mobility Baseline: 30 seconds in CAM with  FWW 710/2023: 12 seconds - no AD Goal status: MET   PLAN: PT FREQUENCY: 2x/week   PT DURATION: 10 weeks   PLANNED INTERVENTIONS: Therapeutic exercises, Therapeutic activity, Neuromuscular re-education, Balance training, Gait training, Patient/Family education, Joint mobilization, Aquatic Therapy, Dry Needling, Electrical stimulation, Cryotherapy, Moist heat, Vasopneumatic device, Manual therapy, and Re-evaluation   PLAN FOR NEXT SESSION: assess HEP response, progress gait and LE strength/balance, work on gait in // bars outside of CAM boot    Ward Chatters, PT 02/14/2022, 4:02 PM

## 2022-02-18 DIAGNOSIS — S82851D Displaced trimalleolar fracture of right lower leg, subsequent encounter for closed fracture with routine healing: Secondary | ICD-10-CM | POA: Diagnosis not present

## 2022-02-22 ENCOUNTER — Ambulatory Visit: Payer: Medicare Other

## 2022-02-22 DIAGNOSIS — M6281 Muscle weakness (generalized): Secondary | ICD-10-CM | POA: Diagnosis not present

## 2022-02-22 DIAGNOSIS — R2689 Other abnormalities of gait and mobility: Secondary | ICD-10-CM | POA: Diagnosis not present

## 2022-02-22 DIAGNOSIS — R2681 Unsteadiness on feet: Secondary | ICD-10-CM | POA: Diagnosis not present

## 2022-02-22 NOTE — Therapy (Signed)
OUTPATIENT PHYSICAL THERAPY TREATMENT NOTE   Patient Name: Jessica Anthony MRN: [id number] DOB:Mar 16, 1946, 76 y.o., female Today's Date: 02/22/2022  PCP: Carol Ada, MD REFERRING PROVIDER: Armond Hang, MD  END OF SESSION:   PT End of Session - 02/22/22 1433     Visit Number 11    Number of Visits 20    Date for PT Re-Evaluation 03/23/22    Authorization Type [employer] Medicare    PT Start Time 1440    PT Stop Time 1520    PT Time Calculation (min) 40 min    Activity Tolerance Patient tolerated treatment well    Behavior During Therapy WFL for tasks assessed/performed               Past Medical History:  Diagnosis Date   Detached retina    Hyperlipemia    Hypertension    Past Surgical History:  Procedure Laterality Date   ORIF ANKLE FRACTURE Right 10/30/2021   Procedure: RIGHT TRIMALLEOLAR OPEN REDUCTION INTERNAL FIXATION (ORIF) ANKLE FRACTURE,;  Surgeon: Armond Hang, MD;  Location: [facility];  Service: Orthopedics;  Laterality: Right;   TOTAL HIP ARTHROPLASTY Right 07/08/2021   Procedure: TOTAL HIP ARTHROPLASTY ANTERIOR APPROACH;  Surgeon: Rod Can, MD;  Location: [facility];  Service: Orthopedics;  Laterality: Right;   Patient Active Problem List   Diagnosis Date Noted   Hypokalemia 10/28/2021   Trimalleolar fracture of ankle, closed, right, initial encounter 10/28/2021   Femoral neck fracture ([place]) 07/07/2021   Essential hypertension 07/07/2021   Anxiety 07/07/2021   Depression 07/07/2021   Hyperlipidemia 12/19/2014    REFERRING DIAG: closed trimalleolar fracture of right ankle  THERAPY DIAG:  Muscle weakness (generalized)  Unsteadiness on feet  Other abnormalities of gait and mobility  Rationale for Evaluation and Treatment Rehabilitation  PERTINENT HISTORY: HTN, Hx of R anterior THA  PRECAUTIONS: None  ONSET DATE: 10/27/2021  SUBJECTIVE: Patient states she isn't having any pain right now but reports occasional pain in RLE.   PAIN:   Are you having pain?  No: NPRS scale: 0/10 (1/10) Pain location: R ankle Pain description: tight Aggravating factors: prolonged standing, walking Relieving factors: rest   OBJECTIVE: (objective measures completed at initial evaluation unless otherwise dated)   PATIENT SURVEYS:  FOTO: 63% function   LOWER EXTREMITY ROM:   AROM Right 01/12/2022 Right 02/14/2022  Ankle dorsiflexion -5  -2   Ankle plantarflexion      Ankle inversion      Ankle eversion       (Blank rows = not tested)   LOWER EXTREMITY MMT:     MMT Right 01/12/2022  Left 01/12/2022   Hip flexion       Hip extension      Hip abduction      Hip adduction      Hip external rotation      Hip internal rotation      Knee extension      Knee flexion      Ankle dorsiflexion  3+/5    Ankle plantarflexion 3+/5    Ankle inversion 3+/5    Ankle eversion 3+/5    Grossly   5/5  (Blank rows = not tested)   FUNCTIONAL TESTS:  Five Time Sit to Stand: 15 seconds TUG: 12 seconds no AD    TODAY'S TREATMENT: [facility] Adult PT Treatment:  DATE: 02/22/2022 Therapeutic Exercise: NuStep lvl 5 UE/LE x 5 min while taking subjective R ankle 4-way GTB x 15 each Standing gastroc stretch 2x30" R Standing soleus stretch 2x30" R Standing heel-toe raises x 25 Wobble board 2x20 fwd/bwd Slant board gastroc stretch 3x45" NMRE: Tandem stance x30" each Romberg stance on Airex 2x30" Heel raises on foam x20 Tandem stance on foam x30" BIL   [facility] Adult PT Treatment:                                                DATE: 02/14/2022 Therapeutic Exercise: NuStep lvl 4 UE/LE x 5 min while taking subjective R ankle 4-way GTB x 15 each Calf stretch with towep 2x30" Standing gastroc stretch 2x30" R Standing soleus stretch 2x30" R Tandem stance x 30" each Standing heel-toe raises x 25 Wobble board 2x20 fwd/bwd Therapeutic Activity: Assessment of tests/measures, goals and outcomes for progress  note   [facility] Adult PT Treatment:                                                DATE: 02/09/2022 Therapeutic Exercise: NuStep lvl 5 UE/LE x 5 min while taking subjective Slant board stretch x 30" STS 2x10 - 5# Supine SLR 2x10 each R ankle DF/Inv/Ev RTB 2x15 Step ups x 10 8in each Standing heel-toe raises x 20 Standing hip abd/ext x 10 Wall squat x 8 Neuro Re-Ed: Tandem stance 2x30" each   PATIENT EDUCATION:  Education details: continue HEP Person educated: Patient Education method: Explanation, Demonstration, and Handouts Education comprehension: verbalized understanding and returned demonstration     HOME EXERCISE PROGRAM: Access Code: [email or web address] URL: [email or web address] Date: 02/14/2022 Prepared by: Octavio Manns  Exercises - Long Sitting Calf Stretch with Strap  - 3 x daily - 7 x weekly - 2-3 reps - 30 seconds hold - Gastroc Stretch on Wall  - 2 x daily - 7 x weekly - 2-3 reps - 30 seconds hold - Soleus Stretch on Wall  - 2 x daily - 7 x weekly - 2-3 reps - 30 seconds hold - Long Sitting Ankle Dorsiflexion with Anchored Resistance  - 1 x daily - 7 x weekly - 3 sets - 10 reps - Long Sitting Ankle Eversion with Resistance  - 1 x daily - 7 x weekly - 3 sets - 10 reps - Long Sitting Ankle Inversion with Resistance  - 1 x daily - 7 x weekly - 3 sets - 10 reps - Long Sitting Ankle Plantar Flexion with Resistance  - 1 x daily - 7 x weekly - 3 sets - 10 reps - Sit to Stand Without Arm Support  - 1 x daily - 7 x weekly - 3 sets - 10 reps - Standing Tandem Balance with Counter Support  - 1 x daily - 7 x weekly - 2-3 reps - 30 sec hold   ASSESSMENT:   CLINICAL IMPRESSION: Patient presents to PT with no current pain in her ankle. Session today focused on Rt ankle and LE strengthening and particular focus on DF ROM on Rt. Patient was able to tolerate all prescribed exercises with no adverse effects. Patient continues to benefit from skilled PT services and should be  progressed as able to  improve functional independence.    OBJECTIVE IMPAIRMENTS decreased activity tolerance, decreased balance, decreased mobility, difficulty walking, decreased ROM, decreased strength, and pain.    ACTIVITY LIMITATIONS carrying, lifting, bending, standing, squatting, stairs, and transfers   PARTICIPATION LIMITATIONS: meal prep, cleaning, laundry, driving, community activity, and yard work   [place], Time since onset of injury/illness/exacerbation, and 1-2 comorbidities: HTN  are also affecting patient's functional outcome.      GOALS: Goals reviewed with patient? No   SHORT TERM GOALS: Target date: 02/02/2022  Pt will be compliant and knowledgeable with initial HEP for improved comfort and carryover Baseline: initial HEP given Goal status: MET   2.  Pt will self report no right ankle pain for improved comfort and functional ability Baseline: 1/10 at worst Goal status: MET   LONG TERM GOALS: Target date: 03/23/2022    Pt will improve FOTO function score to no less than 69% as proxy for functional improvement Baseline: 53% function 02/14/2022: 63% function Goal status: ONGOING   2.  Pt will improve R ankle DF to no less than 10 deg for improved functional mobility and gait Baseline: lacking 5 deg to neutral 02/14/2022: -2 deg Goal status: ONGOING   3.  Pt will decrease Five Time Sit to Stand to no greater than 15 seconds for improved safety and functional mobility Baseline: 23 seconds 02/14/2022: 15 seconds Goal status: MET   4.  Pt will decrease TUG to no greater than 15 seconds with no AD for improved balance and mobility Baseline: 30 seconds in CAM with FWW 710/2023: 12 seconds - no AD Goal status: MET   PLAN: PT FREQUENCY: 2x/week   PT DURATION: 10 weeks   PLANNED INTERVENTIONS: Therapeutic exercises, Therapeutic activity, Neuromuscular re-education, Balance training, Gait training, Patient/Family education, Joint mobilization,  Aquatic Therapy, Dry Needling, Electrical stimulation, Cryotherapy, Moist heat, Vasopneumatic device, Manual therapy, and Re-evaluation   PLAN FOR NEXT SESSION: assess HEP response, progress gait and LE strength/balance    Evelene Croon, PTA 02/22/2022, 3:20 PM

## 2022-02-24 ENCOUNTER — Ambulatory Visit: Payer: Medicare Other

## 2022-02-24 DIAGNOSIS — R2681 Unsteadiness on feet: Secondary | ICD-10-CM | POA: Diagnosis not present

## 2022-02-24 DIAGNOSIS — R2689 Other abnormalities of gait and mobility: Secondary | ICD-10-CM

## 2022-02-24 DIAGNOSIS — M6281 Muscle weakness (generalized): Secondary | ICD-10-CM | POA: Diagnosis not present

## 2022-02-24 NOTE — Therapy (Addendum)
OUTPATIENT PHYSICAL THERAPY TREATMENT NOTE/DISCHARGE  PHYSICAL THERAPY DISCHARGE SUMMARY  Visits from Start of Care: 12  Current functional level related to goals / functional outcomes: See goals and objective measurements   Remaining deficits: See goals and objective measurements   Education / Equipment: HEP   Patient agrees to discharge. Patient goals were  mostly met . Patient is being discharged due to being pleased with the current functional level.   Patient Name: Jessica Anthony MRN: [id number] DOB:August 22, 1945, 76 y.o., female Today's Date: 02/24/2022  PCP: Carol Ada, MD REFERRING PROVIDER: Armond Hang, MD  END OF SESSION:   PT End of Session - 02/24/22 1350     Visit Number 12    Number of Visits 20    Date for PT Re-Evaluation 03/23/22    Authorization Type [employer] Medicare    PT Start Time 1400    PT Stop Time 1440    PT Time Calculation (min) 40 min    Activity Tolerance Patient tolerated treatment well    Behavior During Therapy WFL for tasks assessed/performed                Past Medical History:  Diagnosis Date   Detached retina    Hyperlipemia    Hypertension    Past Surgical History:  Procedure Laterality Date   ORIF ANKLE FRACTURE Right 10/30/2021   Procedure: RIGHT TRIMALLEOLAR OPEN REDUCTION INTERNAL FIXATION (ORIF) ANKLE FRACTURE,;  Surgeon: Armond Hang, MD;  Location: [facility];  Service: Orthopedics;  Laterality: Right;   TOTAL HIP ARTHROPLASTY Right 07/08/2021   Procedure: TOTAL HIP ARTHROPLASTY ANTERIOR APPROACH;  Surgeon: Rod Can, MD;  Location: [facility];  Service: Orthopedics;  Laterality: Right;   Patient Active Problem List   Diagnosis Date Noted   Hypokalemia 10/28/2021   Trimalleolar fracture of ankle, closed, right, initial encounter 10/28/2021   Femoral neck fracture ([place]) 07/07/2021   Essential hypertension 07/07/2021   Anxiety 07/07/2021   Depression 07/07/2021   Hyperlipidemia 12/19/2014     REFERRING DIAG: closed trimalleolar fracture of right ankle  THERAPY DIAG:  Muscle weakness (generalized)  Unsteadiness on feet  Other abnormalities of gait and mobility  Rationale for Evaluation and Treatment Rehabilitation  PERTINENT HISTORY: HTN, Hx of R anterior THA  PRECAUTIONS: None  ONSET DATE: 10/27/2021  SUBJECTIVE:  Pt presents to PT with no current reports of pain or discomfort. Has been compliant with HEP with no adverse effect. Pt is ready to begin PT at this time.   PAIN:  Are you having pain?  No: NPRS scale: 0/10 (1/10) Pain location: R ankle Pain description: tight Aggravating factors: prolonged standing, walking Relieving factors: rest   OBJECTIVE: (objective measures completed at initial evaluation unless otherwise dated)   PATIENT SURVEYS:  FOTO: 63% function   LOWER EXTREMITY ROM:   AROM Right 01/12/2022 Right 02/14/2022  Ankle dorsiflexion -5  -2   Ankle plantarflexion      Ankle inversion      Ankle eversion       (Blank rows = not tested)   LOWER EXTREMITY MMT:     MMT Right 01/12/2022  Left 01/12/2022   Hip flexion       Hip extension      Hip abduction      Hip adduction      Hip external rotation      Hip internal rotation      Knee extension      Knee flexion      Ankle dorsiflexion  3+/5    Ankle plantarflexion 3+/5    Ankle inversion 3+/5    Ankle eversion 3+/5    Grossly   5/5  (Blank rows = not tested)   FUNCTIONAL TESTS:  Five Time Sit to Stand: 15 seconds TUG: 12 seconds no AD    TODAY'S TREATMENT: [facility] Adult PT Treatment:                                                DATE: 02/24/2022 Therapeutic Exercise: NuStep lvl 5 UE/LE x 5 min while taking subjective R ankle 4-way GTB 2x15 each Slant board calf stretch 3x45"  Standing heel-toe raises 2x20 Wobble board 2x20 fwd/bwd STS 2x10  BAPS L3 x 10 cw/ccw R Tandem stance on foam x30" BIL  [facility] Adult PT Treatment:                                                 DATE: 02/22/2022 Therapeutic Exercise: NuStep lvl 5 UE/LE x 5 min while taking subjective R ankle 4-way GTB x 15 each Standing gastroc stretch 2x30" R Standing soleus stretch 2x30" R Standing heel-toe raises x 25 Wobble board 2x20 fwd/bwd Slant board gastroc stretch 3x45" NMRE: Tandem stance x30" each Romberg stance on Airex 2x30" Heel raises on foam x20 Tandem stance on foam x30" BIL   [facility] Adult PT Treatment:                                                DATE: 02/14/2022 Therapeutic Exercise: NuStep lvl 4 UE/LE x 5 min while taking subjective R ankle 4-way GTB x 15 each Calf stretch with towep 2x30" Standing gastroc stretch 2x30" R Standing soleus stretch 2x30" R Tandem stance x 30" each Standing heel-toe raises x 25 Wobble board 2x20 fwd/bwd Therapeutic Activity: Assessment of tests/measures, goals and outcomes for progress note   PATIENT EDUCATION:  Education details: continue HEP Person educated: Patient Education method: Explanation, Demonstration, and Handouts Education comprehension: verbalized understanding and returned demonstration     HOME EXERCISE PROGRAM: Access Code: [email or web address] URL: [email or web address] Date: 02/14/2022 Prepared by: Octavio Manns  Exercises - Long Sitting Calf Stretch with Strap  - 3 x daily - 7 x weekly - 2-3 reps - 30 seconds hold - Gastroc Stretch on Wall  - 2 x daily - 7 x weekly - 2-3 reps - 30 seconds hold - Soleus Stretch on Wall  - 2 x daily - 7 x weekly - 2-3 reps - 30 seconds hold - Long Sitting Ankle Dorsiflexion with Anchored Resistance  - 1 x daily - 7 x weekly - 3 sets - 10 reps - Long Sitting Ankle Eversion with Resistance  - 1 x daily - 7 x weekly - 3 sets - 10 reps - Long Sitting Ankle Inversion with Resistance  - 1 x daily - 7 x weekly - 3 sets - 10 reps - Long Sitting Ankle Plantar Flexion with Resistance  - 1 x daily - 7 x weekly - 3 sets - 10 reps - Sit to Stand Without Arm Support  - 1 x daily -  7 x  weekly - 3 sets - 10 reps - Standing Tandem Balance with Counter Support  - 1 x daily - 7 x weekly - 2-3 reps - 30 sec hold   ASSESSMENT:   CLINICAL IMPRESSION: Pt was able to complete all prescribed exercises with no adverse effect. Pt has continued to progress very well, with therapy continuing to work on improving ankle stability, gait, and general LE strength. She will schedule one more visit in two weeks as she is returning to her home to live independently. Will most likely discharge at that time barring setback.    OBJECTIVE IMPAIRMENTS decreased activity tolerance, decreased balance, decreased mobility, difficulty walking, decreased ROM, decreased strength, and pain.    ACTIVITY LIMITATIONS carrying, lifting, bending, standing, squatting, stairs, and transfers   PARTICIPATION LIMITATIONS: meal prep, cleaning, laundry, driving, community activity, and yard work   [place], Time since onset of injury/illness/exacerbation, and 1-2 comorbidities: HTN  are also affecting patient's functional outcome.      GOALS: Goals reviewed with patient? No   SHORT TERM GOALS: Target date: 02/02/2022  Pt will be compliant and knowledgeable with initial HEP for improved comfort and carryover Baseline: initial HEP given Goal status: MET   2.  Pt will self report no right ankle pain for improved comfort and functional ability Baseline: 1/10 at worst Goal status: MET   LONG TERM GOALS: Target date: 03/23/2022    Pt will improve FOTO function score to no less than 69% as proxy for functional improvement Baseline: 53% function 02/14/2022: 63% function Goal status: ONGOING   2.  Pt will improve R ankle DF to no less than 10 deg for improved functional mobility and gait Baseline: lacking 5 deg to neutral 02/14/2022: -2 deg Goal status: ONGOING   3.  Pt will decrease Five Time Sit to Stand to no greater than 15 seconds for improved safety and functional mobility Baseline: 23  seconds 02/14/2022: 15 seconds Goal status: MET   4.  Pt will decrease TUG to no greater than 15 seconds with no AD for improved balance and mobility Baseline: 30 seconds in CAM with FWW 710/2023: 12 seconds - no AD Goal status: MET   PLAN: PT FREQUENCY: 2x/week   PT DURATION: 10 weeks   PLANNED INTERVENTIONS: Therapeutic exercises, Therapeutic activity, Neuromuscular re-education, Balance training, Gait training, Patient/Family education, Joint mobilization, Aquatic Therapy, Dry Needling, Electrical stimulation, Cryotherapy, Moist heat, Vasopneumatic device, Manual therapy, and Re-evaluation   PLAN FOR NEXT SESSION: assess HEP response, progress gait and LE strength/balance    Ward Chatters, PT 02/24/2022, 2:43 PM

## 2022-03-21 DIAGNOSIS — S82851D Displaced trimalleolar fracture of right lower leg, subsequent encounter for closed fracture with routine healing: Secondary | ICD-10-CM | POA: Diagnosis not present

## 2022-03-31 IMAGING — CT CT ANKLE*R* W/O CM
3 of 5 series · 14 of 34 positions shown, 16 images · non-contrast
Comparison: Right ankle x-rays performed yesterday.

CLINICAL DATA: Trimalleolar ankle fracture.

EXAM:
CT OF THE RIGHT ANKLE WITHOUT CONTRAST
TECHNIQUE: Multidetector CT imaging of the right ankle was performed according
to the standard protocol. Multiplanar CT image reconstructions were
also generated.
RADIATION DOSE REDUCTION: This exam was performed according to the
departmental dose-optimization program which includes automated
exposure control, adjustment of the mA and/or kV according to
patient size and/or use of iterative reconstruction technique.

[Series 5: axial st · axial · 0.32mm/px · z∈[-1335,-1181]mm · 8 of 93 slices shown, 10 images]
[im 8/93  soft-tissue]
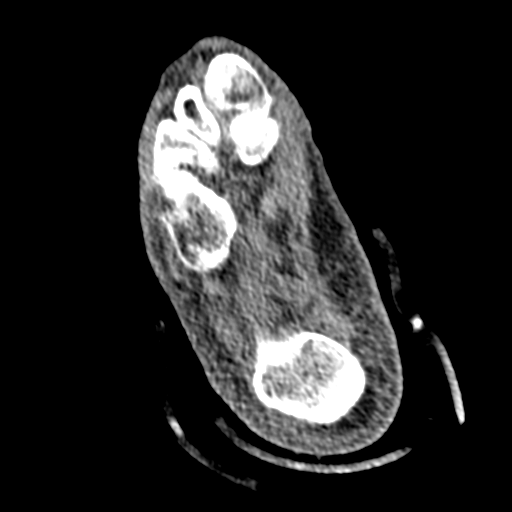
[im 8/93  bone]
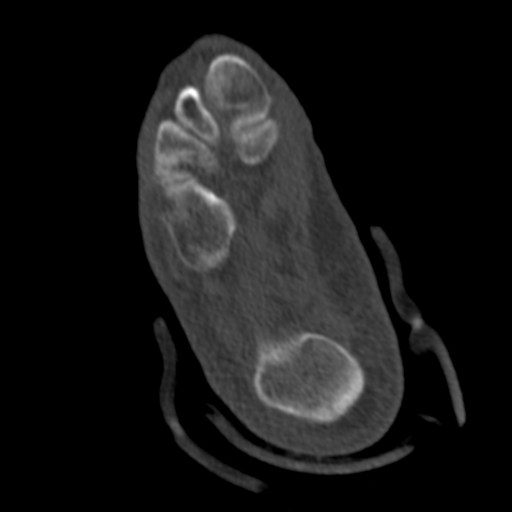
[im 22/93  bone]
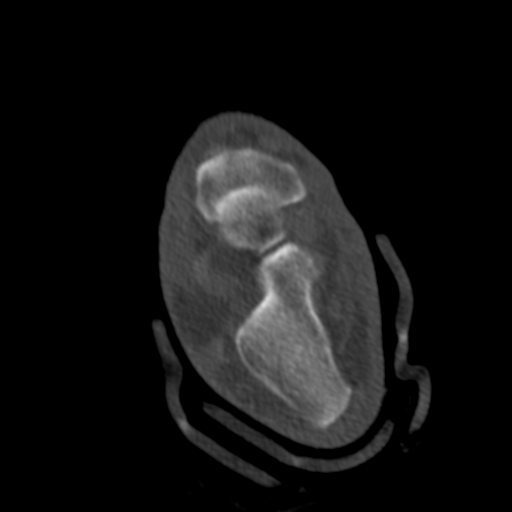
[im 29/93  bone]
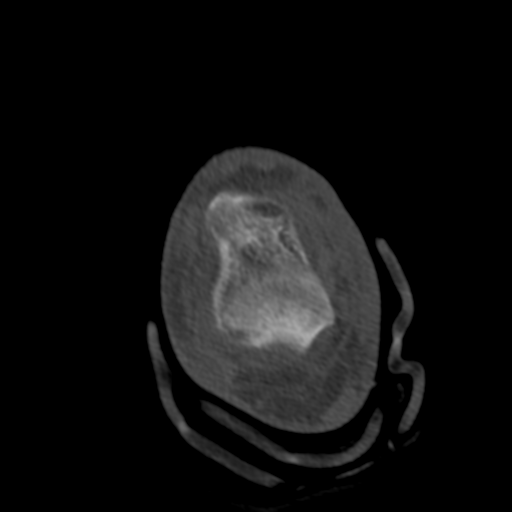
[im 43/93  bone]
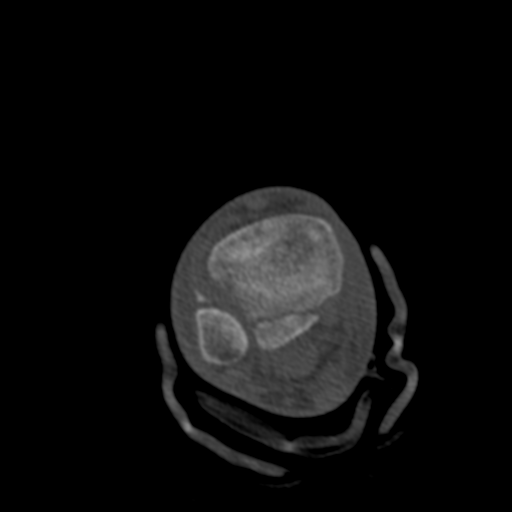
[im 50/93  soft-tissue]
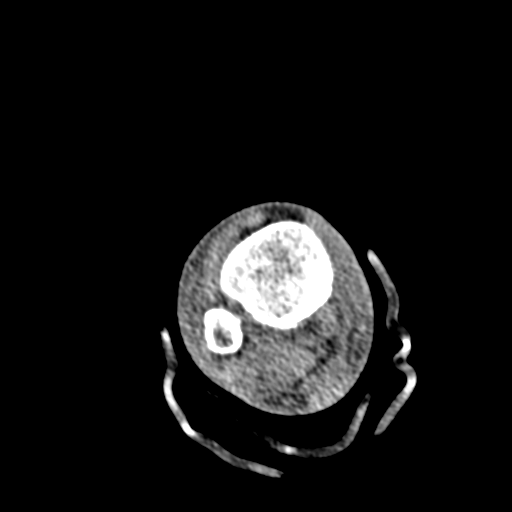
[im 50/93  bone]
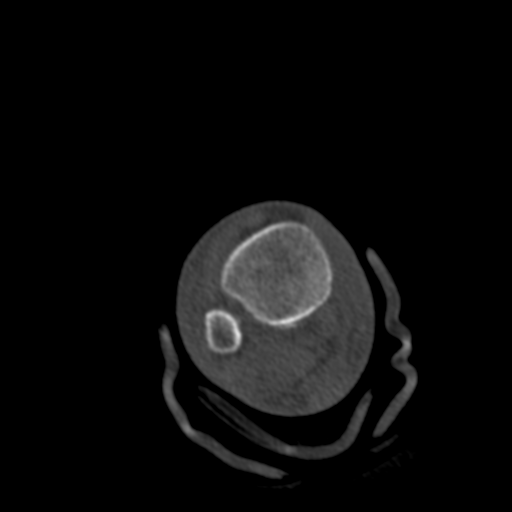
[im 64/93  bone]
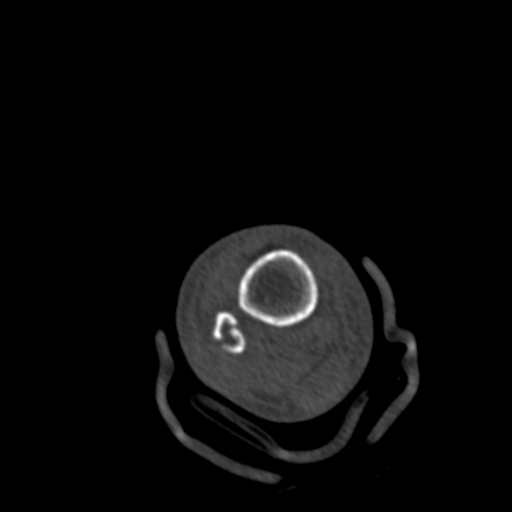
[im 71/93  bone]
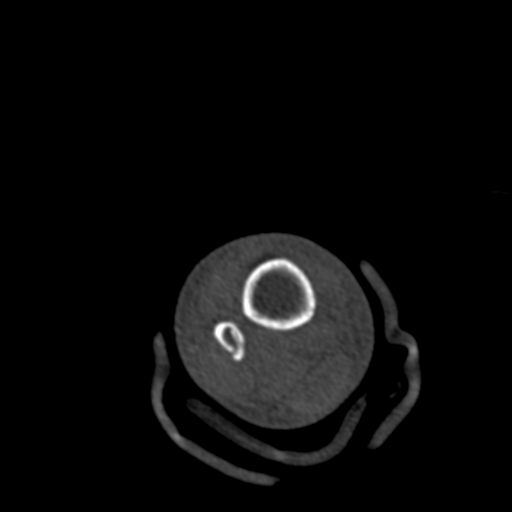
[im 85/93  bone]
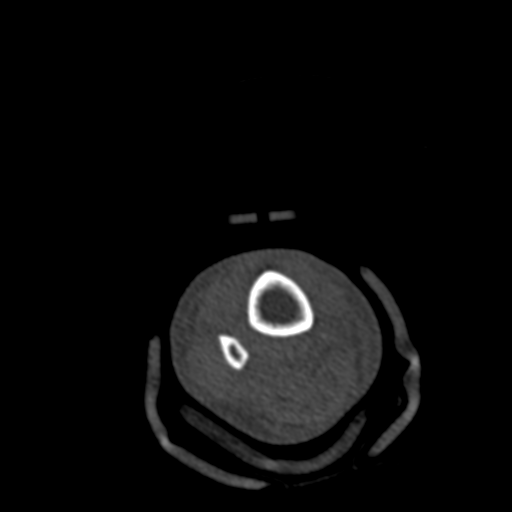

[Series 6: coronal bone · coronal · 0.29mm/px · 1 of 88 slices shown]
[im 44/88  bone]
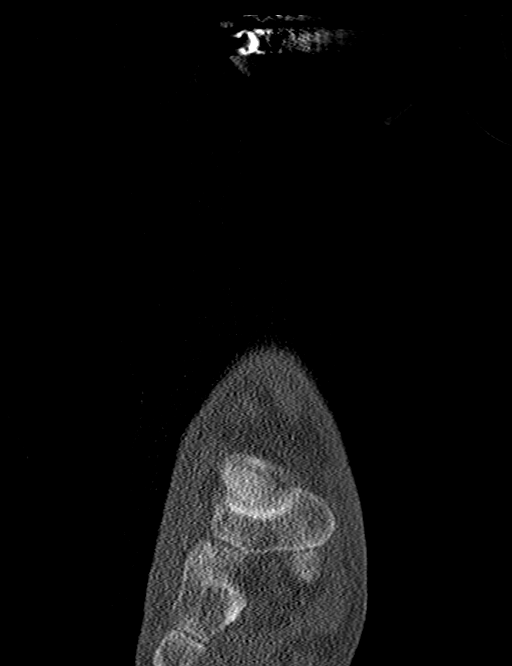

[Series 7: sagittal bone · sagittal · 0.32mm/px · 5 of 45 slices shown]
[im 8/45  bone]
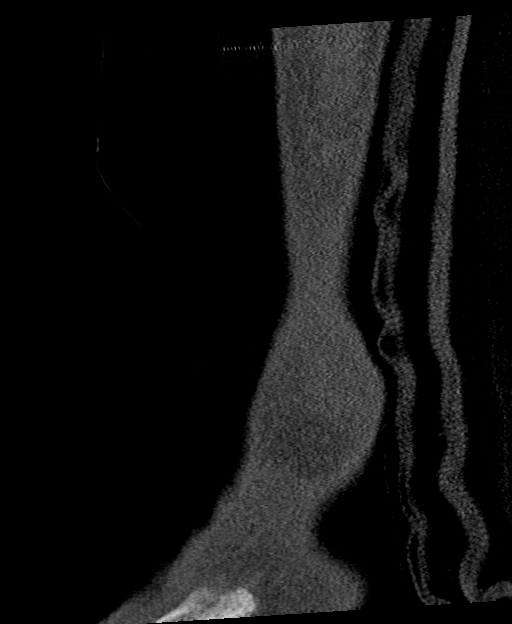
[im 15/45  bone]
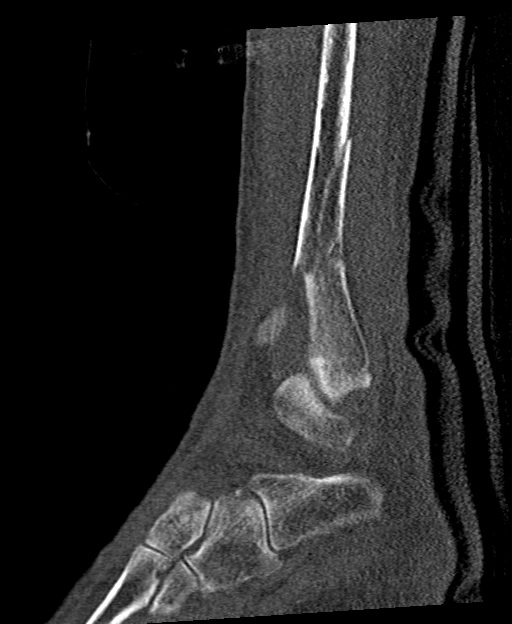
[im 23/45  bone]
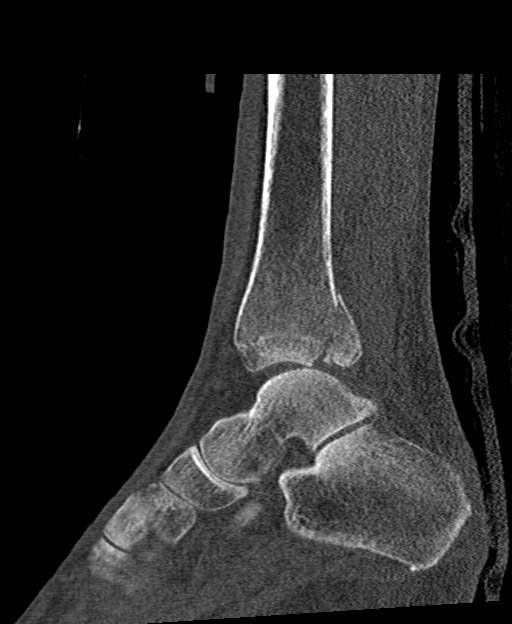
[im 30/45  bone]
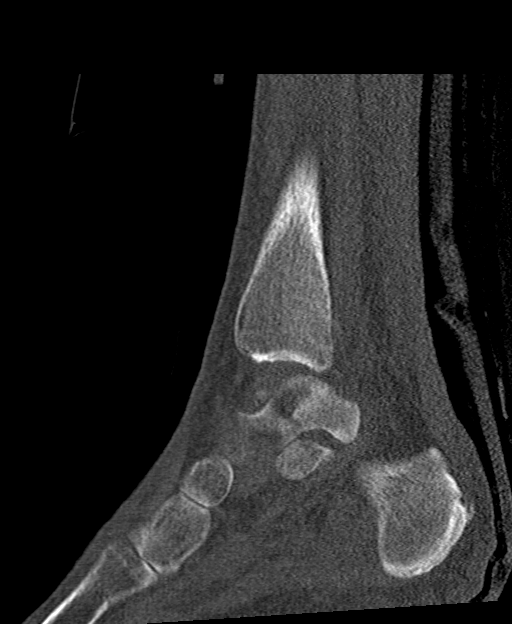
[im 37/45  bone]
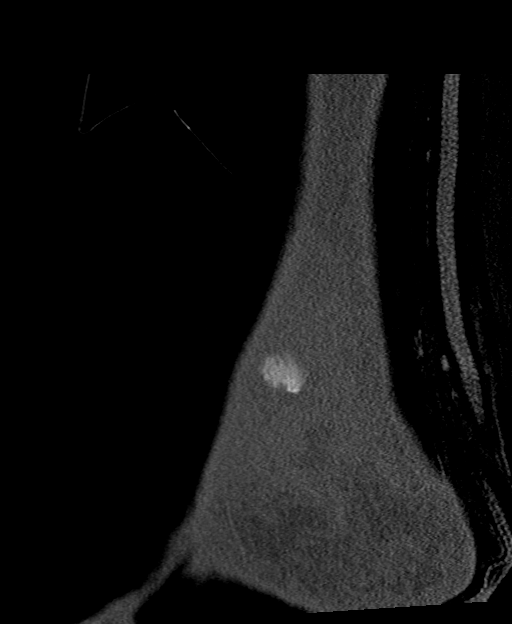

[14 of 34 positions shown; findings below may reference images not displayed]

FINDINGS: Bones/Joint/Cartilage

Mild osteopenia. Trimalleolar ankle fracture is again shown. This
consists of:

A: A comminuted distal fibular shaft fracture mild ventral
angulation at the fracture site and the main distal fracture
fragment displaced about 1 cortex width laterally and posteriorly
with mild posterior angulation, and a butterfly fragment at the
posteromedial aspect of the fracture, which is posteriorly displaced
by 1 cortex width;

B: A transverse fracture of the medial malleolus the main fragment
of which maintains its positioning adjacent the talus with up to 5
mm medial displacement from the parent bone, with numerous tiny
comminution fragments in between;

C: And a comminuted longitudinal intra-articular fracture of the
posterior malleolus with about 1 cortex width posterior
displacement. There is up to 3 mm of cephalad displacement of the
main fragment as well, with tiny nondisplaced comminution fragments
between fracture margins.

There is widening along the medial mortise, narrowing along the
lateral mortise consistent with ligamentous trauma.

There is partial posterior subluxation of the talus in relation to
the tibia such that the posterior tibial margin of the posterior
malleolar fracture impacts against the mid talar dome. The talar
dome contour is preserved.

There is no further evidence of fractures. There is partial joint
space loss in the midfoot articulations but no significant reactive
osteophytes.

Ligaments

Suboptimally assessed by CT. With the widening of the medial
mortise, the medial and lateral stabilizers are almost certainly
compromised. The deltoid ligament is not well seen. No other bony
diastasis is evident.

Muscles and Tendons

Also not well seen with this technique. There is no obvious
intramuscular hematoma in the scan volume. The major tendons
including the Achilles tendon are visible and grossly intact but
certainly a tendon strain injury or intrasubstance tear would be
impossible to exclude.

Soft tissues

There is diffuse soft tissue swelling in the distal foreleg and
ankle extending over the hindfoot.

Other: Small posterior Achilles insertional calcaneal enthesophyte.
IMPRESSION: 1. Trimalleolar fracture with widening of the medial mortise and
partial posterior tibial subluxation, the latter appears improved
compared to the earlier plain films but the mortise widening seems
unchanged.
2. Osteopenia and degenerative change.
3. Posterior calcaneal noninflammatory enthesopathy.

## 2022-04-01 DIAGNOSIS — S82851D Displaced trimalleolar fracture of right lower leg, subsequent encounter for closed fracture with routine healing: Secondary | ICD-10-CM | POA: Diagnosis not present

## 2022-04-02 IMAGING — XA DG ANKLE COMPLETE 3+V*R*
1 series · 6 of 6 positions shown · non-contrast
Comparison: 10/27/2021

CLINICAL DATA: ORIF right ankle fractures.

EXAM:
OPERATIVE RIGHT ANKLE (WITH PELVIS IF PERFORMED) 6 VIEWS
TECHNIQUE: Fluoroscopic spot image(s) were submitted for interpretation
post-operatively.

[Series 1: unknown protocol · 6 of 6 slices shown]
[im 1/6]
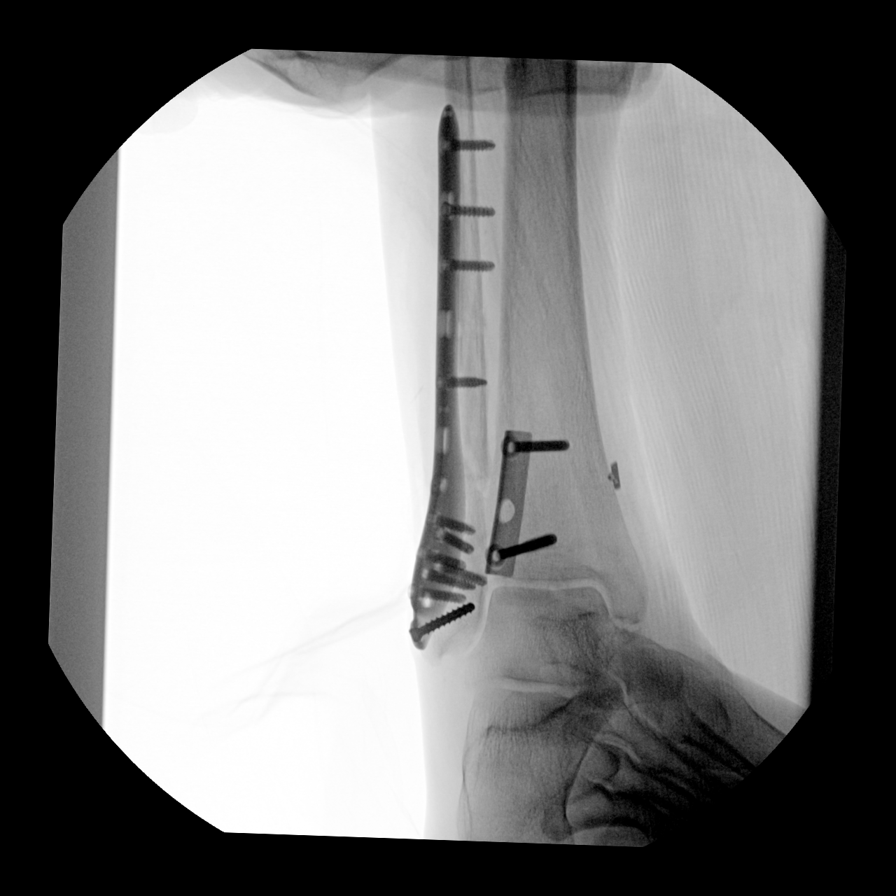
[im 2/6]
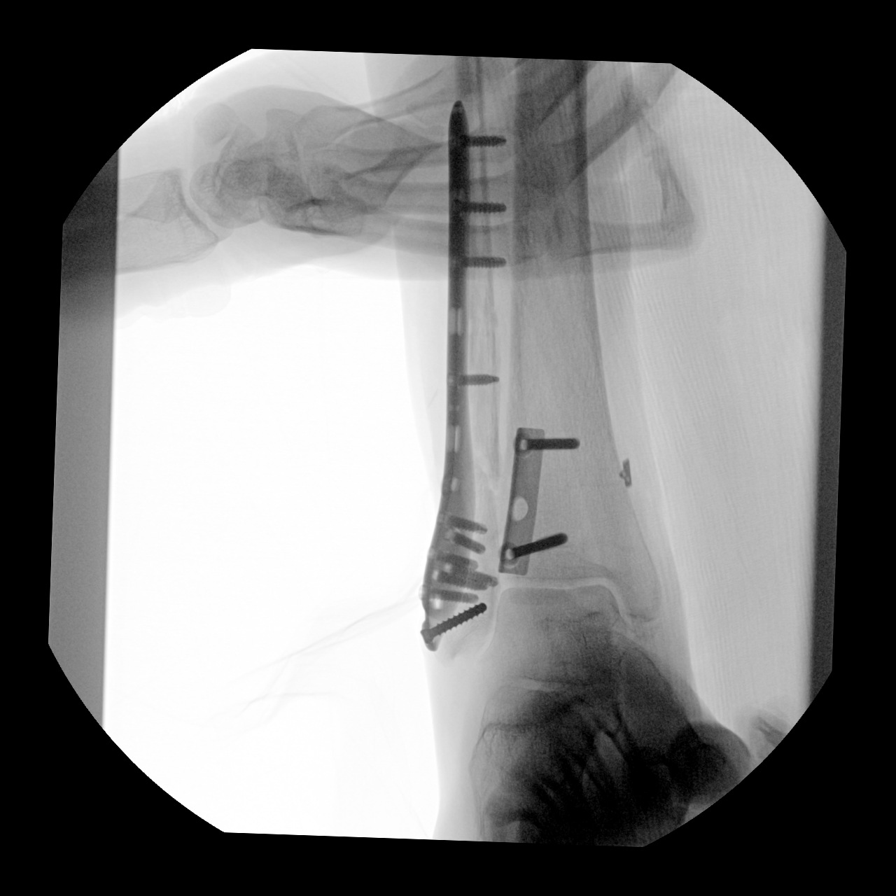
[im 3/6]
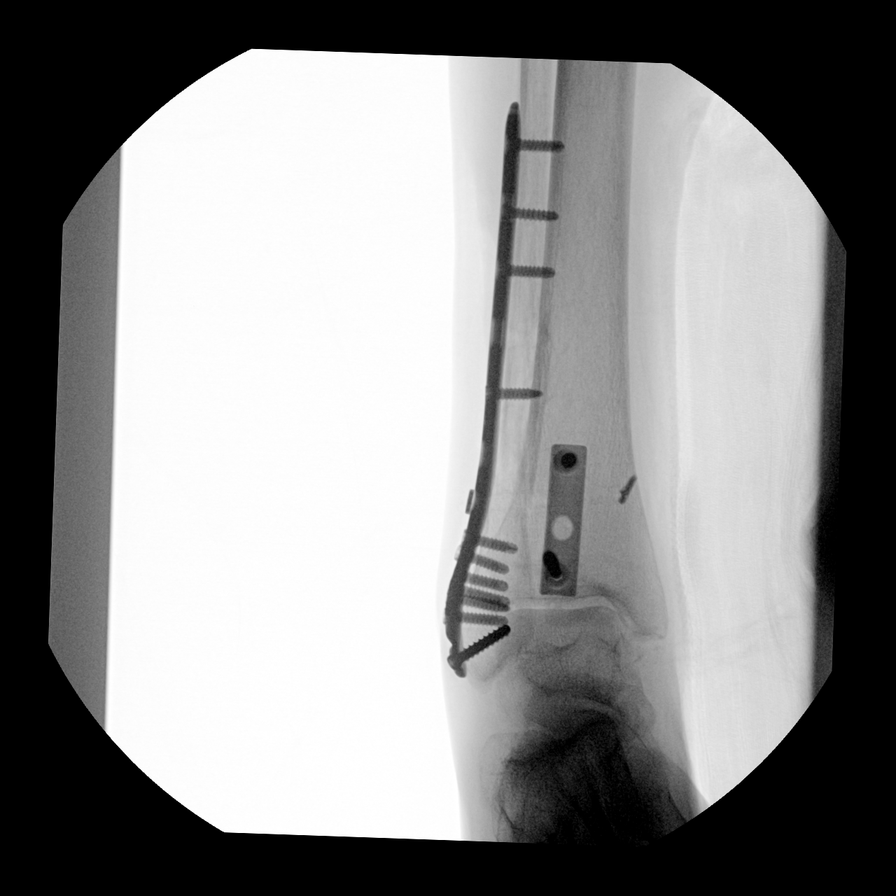
[im 4/6]
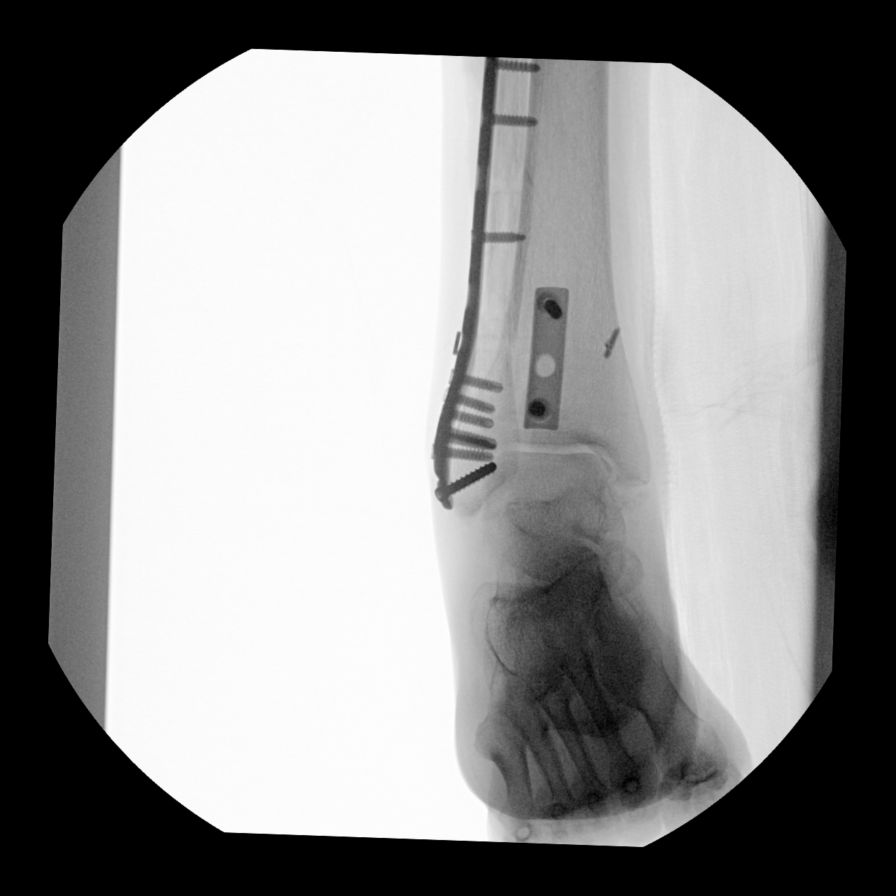
[im 5/6]
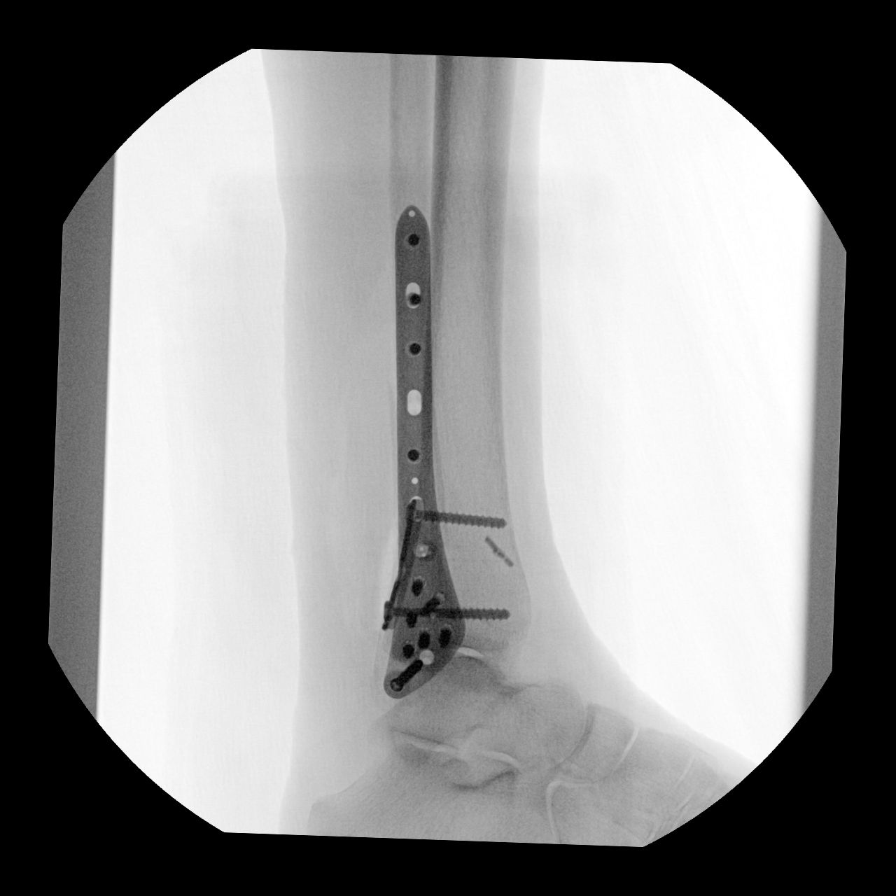
[im 6/6]
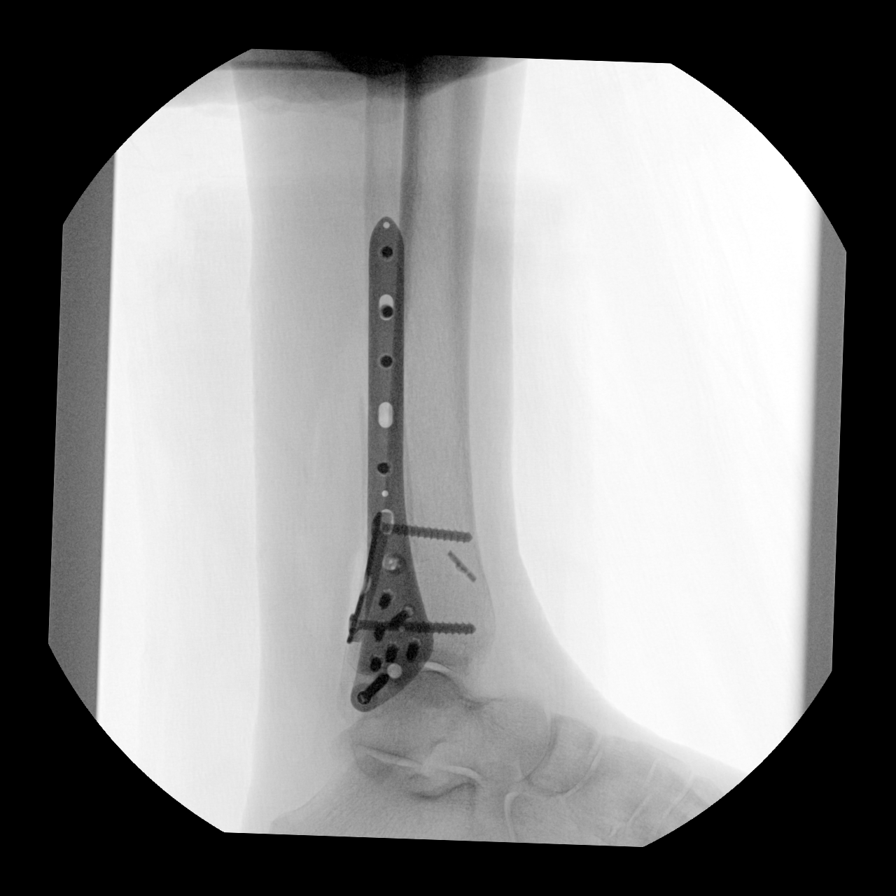

[6 of 6 positions shown; findings below may reference images not displayed]

FINDINGS: Submitted portable images demonstrate placement of a lateral
fixation plate across the distal fibula reducing the fibular
fracture into near anatomic alignment. A posterior fusion plate lies
along the posterior margin of the distal tibia reducing the
posterior malleolar fracture into near anatomic alignment. The talus
is now normally aligned with the tibia. Orthopedic hardware appears
well seated.
IMPRESSION: Well-aligned right ankle fractures following ORIF.

## 2022-04-21 DIAGNOSIS — S82851D Displaced trimalleolar fracture of right lower leg, subsequent encounter for closed fracture with routine healing: Secondary | ICD-10-CM | POA: Diagnosis not present

## 2022-05-21 DIAGNOSIS — S82851D Displaced trimalleolar fracture of right lower leg, subsequent encounter for closed fracture with routine healing: Secondary | ICD-10-CM | POA: Diagnosis not present

## 2022-06-28 DIAGNOSIS — S82851D Displaced trimalleolar fracture of right lower leg, subsequent encounter for closed fracture with routine healing: Secondary | ICD-10-CM | POA: Diagnosis not present

## 2022-07-08 DIAGNOSIS — S82851D Displaced trimalleolar fracture of right lower leg, subsequent encounter for closed fracture with routine healing: Secondary | ICD-10-CM | POA: Diagnosis not present

## 2022-08-03 DIAGNOSIS — R195 Other fecal abnormalities: Secondary | ICD-10-CM | POA: Diagnosis not present

## 2022-08-21 DIAGNOSIS — S82851D Displaced trimalleolar fracture of right lower leg, subsequent encounter for closed fracture with routine healing: Secondary | ICD-10-CM | POA: Diagnosis not present

## 2022-08-21 DIAGNOSIS — J449 Chronic obstructive pulmonary disease, unspecified: Secondary | ICD-10-CM | POA: Diagnosis not present

## 2022-09-08 DIAGNOSIS — S82851D Displaced trimalleolar fracture of right lower leg, subsequent encounter for closed fracture with routine healing: Secondary | ICD-10-CM | POA: Diagnosis not present

## 2022-09-21 DIAGNOSIS — S82851D Displaced trimalleolar fracture of right lower leg, subsequent encounter for closed fracture with routine healing: Secondary | ICD-10-CM | POA: Diagnosis not present

## 2022-09-21 DIAGNOSIS — I10 Essential (primary) hypertension: Secondary | ICD-10-CM | POA: Diagnosis not present

## 2022-09-21 DIAGNOSIS — E782 Mixed hyperlipidemia: Secondary | ICD-10-CM | POA: Diagnosis not present

## 2022-10-11 DIAGNOSIS — M85851 Other specified disorders of bone density and structure, right thigh: Secondary | ICD-10-CM | POA: Diagnosis not present

## 2022-10-11 DIAGNOSIS — Z780 Asymptomatic menopausal state: Secondary | ICD-10-CM | POA: Diagnosis not present

## 2022-10-11 DIAGNOSIS — M810 Age-related osteoporosis without current pathological fracture: Secondary | ICD-10-CM | POA: Diagnosis not present

## 2022-10-11 DIAGNOSIS — Z1231 Encounter for screening mammogram for malignant neoplasm of breast: Secondary | ICD-10-CM | POA: Diagnosis not present

## 2022-10-11 DIAGNOSIS — M85852 Other specified disorders of bone density and structure, left thigh: Secondary | ICD-10-CM | POA: Diagnosis not present

## 2022-10-20 DIAGNOSIS — S82851D Displaced trimalleolar fracture of right lower leg, subsequent encounter for closed fracture with routine healing: Secondary | ICD-10-CM | POA: Diagnosis not present

## 2022-11-20 DIAGNOSIS — S82851D Displaced trimalleolar fracture of right lower leg, subsequent encounter for closed fracture with routine healing: Secondary | ICD-10-CM | POA: Diagnosis not present

## 2023-02-02 DIAGNOSIS — E782 Mixed hyperlipidemia: Secondary | ICD-10-CM | POA: Diagnosis not present

## 2023-02-02 DIAGNOSIS — Z0000 Encounter for general adult medical examination without abnormal findings: Secondary | ICD-10-CM | POA: Diagnosis not present

## 2023-02-02 DIAGNOSIS — Z1211 Encounter for screening for malignant neoplasm of colon: Secondary | ICD-10-CM | POA: Diagnosis not present

## 2023-02-02 DIAGNOSIS — I10 Essential (primary) hypertension: Secondary | ICD-10-CM | POA: Diagnosis not present

## 2023-03-14 DIAGNOSIS — Z4889 Encounter for other specified surgical aftercare: Secondary | ICD-10-CM | POA: Diagnosis not present

## 2023-03-14 DIAGNOSIS — S82851D Displaced trimalleolar fracture of right lower leg, subsequent encounter for closed fracture with routine healing: Secondary | ICD-10-CM | POA: Diagnosis not present

## 2023-04-05 DIAGNOSIS — E782 Mixed hyperlipidemia: Secondary | ICD-10-CM | POA: Diagnosis not present

## 2023-05-23 DIAGNOSIS — E782 Mixed hyperlipidemia: Secondary | ICD-10-CM | POA: Diagnosis not present

## 2023-07-25 DIAGNOSIS — I10 Essential (primary) hypertension: Secondary | ICD-10-CM | POA: Diagnosis not present

## 2023-07-25 DIAGNOSIS — G4700 Insomnia, unspecified: Secondary | ICD-10-CM | POA: Diagnosis not present

## 2023-07-25 DIAGNOSIS — E782 Mixed hyperlipidemia: Secondary | ICD-10-CM | POA: Diagnosis not present

## 2023-09-12 DIAGNOSIS — L819 Disorder of pigmentation, unspecified: Secondary | ICD-10-CM | POA: Diagnosis not present

## 2024-02-26 DIAGNOSIS — Z0000 Encounter for general adult medical examination without abnormal findings: Secondary | ICD-10-CM | POA: Diagnosis not present

## 2024-02-26 DIAGNOSIS — Z1211 Encounter for screening for malignant neoplasm of colon: Secondary | ICD-10-CM | POA: Diagnosis not present

## 2024-02-26 DIAGNOSIS — G4700 Insomnia, unspecified: Secondary | ICD-10-CM | POA: Diagnosis not present

## 2024-02-26 DIAGNOSIS — Z23 Encounter for immunization: Secondary | ICD-10-CM | POA: Diagnosis not present

## 2024-02-26 DIAGNOSIS — I10 Essential (primary) hypertension: Secondary | ICD-10-CM | POA: Diagnosis not present

## 2024-02-26 DIAGNOSIS — E782 Mixed hyperlipidemia: Secondary | ICD-10-CM | POA: Diagnosis not present

## 2024-03-12 DIAGNOSIS — S82851D Displaced trimalleolar fracture of right lower leg, subsequent encounter for closed fracture with routine healing: Secondary | ICD-10-CM | POA: Diagnosis not present

## 2024-04-12 DIAGNOSIS — Z8781 Personal history of (healed) traumatic fracture: Secondary | ICD-10-CM | POA: Diagnosis not present

## 2024-04-12 DIAGNOSIS — M810 Age-related osteoporosis without current pathological fracture: Secondary | ICD-10-CM | POA: Diagnosis not present

## 2024-04-12 DIAGNOSIS — E782 Mixed hyperlipidemia: Secondary | ICD-10-CM | POA: Diagnosis not present

## 2024-04-12 DIAGNOSIS — I10 Essential (primary) hypertension: Secondary | ICD-10-CM | POA: Diagnosis not present

## 2024-06-05 DIAGNOSIS — Z961 Presence of intraocular lens: Secondary | ICD-10-CM | POA: Diagnosis not present

## 2024-06-05 DIAGNOSIS — H02401 Unspecified ptosis of right eyelid: Secondary | ICD-10-CM | POA: Diagnosis not present

## 2024-06-05 DIAGNOSIS — H04123 Dry eye syndrome of bilateral lacrimal glands: Secondary | ICD-10-CM | POA: Diagnosis not present
